# Patient Record
Sex: Male | Born: 1955
Health system: Southern US, Community
[De-identification: ages and names within clinical notes are randomized; demographics above are authoritative.]

## PROBLEM LIST (undated history)

## (undated) DIAGNOSIS — I2510 Atherosclerotic heart disease of native coronary artery without angina pectoris: Secondary | ICD-10-CM

## (undated) DIAGNOSIS — I10 Essential (primary) hypertension: Secondary | ICD-10-CM

## (undated) DIAGNOSIS — E785 Hyperlipidemia, unspecified: Secondary | ICD-10-CM

## (undated) DIAGNOSIS — I77810 Thoracic aortic ectasia: Secondary | ICD-10-CM

## (undated) DIAGNOSIS — Z9289 Personal history of other medical treatment: Secondary | ICD-10-CM

## (undated) DIAGNOSIS — G4733 Obstructive sleep apnea (adult) (pediatric): Secondary | ICD-10-CM

## (undated) DIAGNOSIS — R29898 Other symptoms and signs involving the musculoskeletal system: Secondary | ICD-10-CM

## (undated) DIAGNOSIS — G4730 Sleep apnea, unspecified: Secondary | ICD-10-CM

## (undated) DIAGNOSIS — K589 Irritable bowel syndrome without diarrhea: Secondary | ICD-10-CM

## (undated) DIAGNOSIS — I7121 Aneurysm of the ascending aorta, without rupture: Secondary | ICD-10-CM

## (undated) DIAGNOSIS — Z91199 Patient's noncompliance with other medical treatment and regimen due to unspecified reason: Secondary | ICD-10-CM

## (undated) DIAGNOSIS — Z9119 Patient's noncompliance with other medical treatment and regimen: Secondary | ICD-10-CM

## (undated) DIAGNOSIS — E669 Obesity, unspecified: Secondary | ICD-10-CM

## (undated) DIAGNOSIS — I700 Atherosclerosis of aorta: Secondary | ICD-10-CM

## (undated) DIAGNOSIS — G629 Polyneuropathy, unspecified: Secondary | ICD-10-CM

## (undated) DIAGNOSIS — I712 Thoracic aortic aneurysm, without rupture: Secondary | ICD-10-CM

## (undated) HISTORY — DX: Personal history of other medical treatment: Z92.89

## (undated) HISTORY — DX: Essential (primary) hypertension: I10

## (undated) HISTORY — DX: Sleep apnea, unspecified: G47.30

## (undated) HISTORY — DX: Obstructive sleep apnea (adult) (pediatric): G47.33

## (undated) HISTORY — DX: Polyneuropathy, unspecified: G62.9

## (undated) HISTORY — DX: Patient's noncompliance with other medical treatment and regimen due to unspecified reason: Z91.199

## (undated) HISTORY — DX: Thoracic aortic ectasia: I77.810

## (undated) HISTORY — PX: COLONOSCOPY: SHX174

## (undated) HISTORY — DX: Other symptoms and signs involving the musculoskeletal system: R29.898

## (undated) HISTORY — DX: Obesity, unspecified: E66.9

## (undated) HISTORY — DX: Patient's noncompliance with other medical treatment and regimen: Z91.19

## (undated) HISTORY — DX: Irritable bowel syndrome, unspecified: K58.9

## (undated) HISTORY — DX: Hyperlipidemia, unspecified: E78.5

---

## 1976-02-29 HISTORY — PX: SHOULDER SURGERY: SHX246

## 2002-05-29 ENCOUNTER — Encounter: Payer: Self-pay | Admitting: Internal Medicine

## 2005-02-28 DIAGNOSIS — Z9289 Personal history of other medical treatment: Secondary | ICD-10-CM

## 2005-02-28 HISTORY — DX: Personal history of other medical treatment: Z92.89

## 2006-01-25 ENCOUNTER — Ambulatory Visit: Payer: Self-pay | Admitting: Pulmonary Disease

## 2006-03-01 ENCOUNTER — Ambulatory Visit: Payer: Self-pay | Admitting: Pulmonary Disease

## 2006-03-01 ENCOUNTER — Ambulatory Visit (HOSPITAL_BASED_OUTPATIENT_CLINIC_OR_DEPARTMENT_OTHER): Admission: RE | Admit: 2006-03-01 | Discharge: 2006-03-01 | Payer: Self-pay | Admitting: Pulmonary Disease

## 2006-05-16 ENCOUNTER — Ambulatory Visit: Payer: Self-pay | Admitting: Pulmonary Disease

## 2008-01-07 ENCOUNTER — Emergency Department (HOSPITAL_COMMUNITY): Admission: EM | Admit: 2008-01-07 | Discharge: 2008-01-07 | Payer: Self-pay | Admitting: Emergency Medicine

## 2008-07-01 ENCOUNTER — Ambulatory Visit: Payer: Self-pay | Admitting: Internal Medicine

## 2008-07-01 DIAGNOSIS — K602 Anal fissure, unspecified: Secondary | ICD-10-CM | POA: Insufficient documentation

## 2008-07-29 ENCOUNTER — Ambulatory Visit: Payer: Self-pay | Admitting: Internal Medicine

## 2008-08-28 ENCOUNTER — Telehealth: Payer: Self-pay | Admitting: Internal Medicine

## 2008-09-17 ENCOUNTER — Ambulatory Visit: Payer: Self-pay | Admitting: Internal Medicine

## 2008-09-17 DIAGNOSIS — K589 Irritable bowel syndrome without diarrhea: Secondary | ICD-10-CM | POA: Insufficient documentation

## 2008-11-17 ENCOUNTER — Ambulatory Visit: Payer: Self-pay | Admitting: Internal Medicine

## 2008-11-17 ENCOUNTER — Telehealth: Payer: Self-pay | Admitting: Internal Medicine

## 2008-11-28 ENCOUNTER — Ambulatory Visit: Payer: Self-pay | Admitting: Internal Medicine

## 2008-11-28 HISTORY — PX: COLONOSCOPY: SHX174

## 2009-06-29 ENCOUNTER — Emergency Department (HOSPITAL_COMMUNITY): Admission: EM | Admit: 2009-06-29 | Discharge: 2009-06-29 | Payer: Self-pay | Admitting: Emergency Medicine

## 2009-07-06 ENCOUNTER — Encounter: Admission: RE | Admit: 2009-07-06 | Discharge: 2009-07-06 | Payer: Self-pay | Admitting: Interventional Cardiology

## 2009-08-12 ENCOUNTER — Encounter: Admission: RE | Admit: 2009-08-12 | Discharge: 2009-08-12 | Payer: Self-pay | Admitting: Family Medicine

## 2009-08-18 ENCOUNTER — Encounter: Admission: RE | Admit: 2009-08-18 | Discharge: 2009-08-18 | Payer: Self-pay | Admitting: Family Medicine

## 2010-05-18 LAB — COMPREHENSIVE METABOLIC PANEL
AST: 30 U/L (ref 0–37)
CO2: 26 mEq/L (ref 19–32)
Calcium: 9.1 mg/dL (ref 8.4–10.5)
Creatinine, Ser: 0.94 mg/dL (ref 0.4–1.5)
GFR calc Af Amer: >60 mL/min (ref >60)
Glucose, Bld: 104 mg/dL — ABNORMAL HIGH (ref 70–99)
Sodium: 138 mEq/L (ref 135–145)

## 2010-05-18 LAB — BRAIN NATRIURETIC PEPTIDE: Pro B Natriuretic peptide (BNP): <30 pg/mL (ref 0.0–100.0)

## 2010-07-16 NOTE — Assessment & Plan Note (Signed)
[facility]                             PULMONARY OFFICE NOTE   NAME:TURNERKaiyden, Simkin                          MRN:          [id number]  DATE:01/25/2006                            DOB:          06/28/1955    I saw Mr. Lyon today for evaluation of his sleep apnea.  He was  originally seen by Dr. Fraser Din approximately one year ago, and from the  patient's description it sounds like he had initially undergone a split-  night study, was diagnosed with central sleep apnea, and then was  subsequently scheduled for a titration study using the adaptive Servo  Ventilator.  After that, he was started on an adaptive Servo Ventilator,  was having quite a bit of difficulty tolerating this.  He says he tries  to use the machine on a nightly basis, but he can use it at most only 2  hours because he has difficulty with the fluctuations in the pressure as  well as difficulty with his mask.  He is currently using a hybrid mask  with a heated humidifier, and this seems to be helping somewhat, but he  is still having quite a bit of difficulty.  He had not been tried on  either CPAP or BiPAP previously, although he may have been tried on some  of these during his initial split-night study.  Unfortunately, I do not  have the copy of these studies for my review at this time.  His fiance  says that he does not snore while he uses his adaptive Servo Ventilator,  but that he does when he is not using it.  He also will have apneic  events when he is not using his machine.  He tends to get a dry mouth  and gets sweaty at night, and when he is not using the machine he will  wake up with a coughing sensation.  He has tried using Ambien CR before,  and he says this helps him fall asleep but he still feels quite tired in  the morning.  His current sleep pattern is that he will go to bed at  around 11 o'clock at night.  He falls asleep reasonably quickly, but  then he is awake several  times during the night.  He wakes up finally at  8:30 in the morning but still feels quite tired, but he denies having  headaches in the morning.  He says that for the most part he does not  have feelings of tiredness during the day as long as he stays active,  but then once he is resting then he will start to feel somewhat tired.  He says she has also had problems with nasal congestion and postnasal  drip, and occasional acid reflux.  He denies sleepwalking or  sleeptalking.  There is no history of nightmares or night terrors.  He  denies symptoms of restless leg syndrome.  There is no history of sleep  hallucinations, sleep paralysis or cataplexy.  He is not currently using  anything to help him fall asleep at night, will stay  awake during the  day.  He has gained approximately 20 pounds over the last two years.  He  denies having any history of heart failure.  He also denies having any  history of traumatic brain injuries, strokes, seizures or other  intracerebral lesions, and there is no history of a seizure disorder.  He also denies illicit drug use.   PAST MEDICAL HISTORY:  Significant for hypertension, elevated  cholesterol.   PAST SURGICAL HISTORY:  Significant for shoulder surgery in 1977.   CURRENT MEDICATIONS:  Lotrel.   ALLERGIES:  No known drug allergies.   FAMILY HISTORY:  Significant for his grandmother who had liver cancer,  and his other grandmother had breast cancer.  He had an uncle who had  liver cancer.   SOCIAL HISTORY:  He is engaged, lives with his fiance.  He denies  tobacco use, he drinks 1 to 2 glasses of wine in the evening.  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   REVIEW OF SYSTEMS:  Essentially negative except for as stated above.   PHYSICAL EXAMINATION:  He is 5 feet 11 inches tall, 259 pounds.  Temperature is 98.3, blood pressure is 152/96, heart rate is 109, oxygen  saturation 94% on room air.  HEENT:  Pupils are reactive, extraocular muscles  are intact.  He has a  clear nasal discharge with boggy nasal mucosa.  He has a Mallampati IV  airway with elongated and boggy uvula, with 3+ tonsils and a large  tongue as well as scalloped borders of his tongue.  There is no  lymphadenopathy, no thyromegaly.  HEART:  S1, S2, regular rhythm.  CHEST:  Clear to auscultation.  ABDOMEN:  Obese, soft, nontender.  EXTREMITIES:  There was no edema, cyanosis or clubbing.  NEUROLOGIC:  He is alert and oriented x3, cranial nerves were intact  with 5/5 strength and no cerebellar deficits were appreciated.   IMPRESSION:  1. Sleep disturbance with symptoms of excessive daytime sleepiness and      sleep apnea, in addition to having obesity and hypertension.  He      apparently was found to have central sleep apnea on his original      polysomnogram; however, it would be unusual for him to have pure      central sleep apnea without having underlying either neurologic      disease, significant cardiac disease, or having illicit drug use,      which he has none of.  Therefore, I would be concerned that he may      in fact actually have obstructive sleep apnea which could have      presented as central apneic events, which is not uncommon, and      oftentimes if he has an esophageal probe during his sleep study      this would help to delineate whether in fact he had central sleep      apnea versus obstructive sleep apnea.  Given the fact that he has      had such difficulty with adjusting to his adaptive Servo Ventilator      in addition to there being some question as to whether he actually      has central sleep apnea versus obstructive sleep apnea, I feel that      it would be warranted for him to undergo an additional split-night      study to better assess whether in fact he does have sleep apnea,  and what form of sleep apnea he has, and then also hopefully to     determine if he should continue on an adaptive Servo Ventilator or       perhaps be changed to either a continuous positive airway pressure      machine or bilevel positive airway pressure machine, as he may be      able to tolerate this somewhat better.  2. Sinus congestion with postnasal drip.  This may be contributing to      some of his difficulties with tolerating his Servo Ventilator.  I      have given him a sample of Nasonex, and he is to use 2 sprays in      each nostril once daily, and I have also advised him to use saline      nasal sprays as needed to help control his nasal symptoms.  3. Enlarged tonsils and elongated uvula.  If he continues to have      difficulty tolerating positive airway pressure therapy for his      sleep apnea, particularly if he requires higher pressure      settings, then it may be warranted for him to be evaluated by ear,      nose and throat to see if      surgical correction could assist in his tolerating positive airway      pressure therapy.  4. Followup will be in 4 to 6 weeks.     Coralyn Helling, MD  Electronically Signed    VS/MedQ  DD: 01/25/2006  DT: 01/26/2006  Job #: [id number]

## 2010-07-16 NOTE — Procedures (Signed)
NAME:  Patrick Gilmore, Patrick Gilmore NO.:  [id number]   MEDICAL RECORD NO.:  [id number]          PATIENT TYPE:  OUT   LOCATION:  SLEEP CENTER                 FACILITY:  [facility]   PHYSICIAN:  Coralyn Helling, MD        DATE OF BIRTH:  [birth year]   DATE OF STUDY:  03/01/2006                            NOCTURNAL POLYSOMNOGRAM   FACILITY:  [facility]   REFERRING PHYSICIAN:  Coralyn Helling, MD   INDICATION FOR STUDY:  This is an individual who had symptoms of sleep  disturbance with excessive daytime sleepiness as well as a history of  hypertension.A  He had undergone a previous sleep study at an outside  facility and was diagnosed with central sleep apnea and started on an  adaptive Servo ventilator, however, he was not able to tolerate this.A  He was then scheduled to have a sleep study at this facility on February 16, 2006, but, he had significant insomnia and was not able to have an  adequate study that night.A  He returns to the sleep lab for a sleep  study to further evaluate the possibility of hypersomnia with  obstructive sleep apnea.   EPWORTH SLEEPINESS SCORE:  7.   MEDICATIONS:  Nasonex, Diovan and he took Ambien 5 mg prior to going to  sleep.A   SLEEP ARCHITECTURE:  Total recording time was 433.5 minutes.A  The  patient followed a split-night study protocol.A  The diagnostic portion  of the study consisted of 246 minutes.A  He spent 47% of the time in  wake, 10% of the time in stage I sleep, 41% of the time in stage II  sleep.A  This portion of the study was notable for lack of slow wave  sleep and REM sleep.A  Sleep latency was 43 minutes which was  prolonged.A  He appeared to have difficulty with sleep initiation due to  respiratory events. He was observed in both the supine and non-supine  positions.A   RESPIRATORY DATA:  The average respiratory rate was 18.A  During the  diagnostic portion of the study the apnea/hypopnea index was noted to be  87.8.A  The  events were exclusively obstructive in nature and moderate  to severe snoring was noted by the technician.A  During the therapeutic  portion of the study the patient was titrated from a CPAP pressure  setting of 5 to 12 cm of water.A  At a CPAP pressure setting of 12 cm of  water the apnea/hypopnea index was reduced to 0 and at this pressure  setting he was observed in REM sleep.   OXYGEN DATA:  The baseline oxygenation was 94%.A  The oxygen saturation  nadir was 55%.A  At a CPAP pressure setting of 12 cm of water the mean  oxygenation during non-REM sleep was 94% and during REM sleep was 94%.A   CARDIAC DATA:  The average heart rate was 72 and the rhythm strip showed  normal sinus rhythm.A   MOVEMENT-PARASOMNIA:  The periodic limb movement index was 0 and the  patient had one trip to the restroom.A   IMPRESSIONS-RECOMMENDATIONS:  This was a split-night study  protocol.A  During the diagnostic portion of the study the apnea/hypopnea index was  87.8 with an oxygen saturation nadir of 55%.A  This would be consistent  with a diagnosis of severe hypersomnia with obstructive sleep apnea.A  During the therapeutic portion of the study, at a CPAP pressure setting  of 12 cm of water, the apnea/hypopnea index was reduced to 0.A  At this  pressure setting, he was observed in REM sleep.A  Additionally, his  oxygenation and sleep architecture appeared to stabilize.A  He was  fitted with a medium Mirage Quattro full face mask and heated  humidification.A      Coralyn Helling, MD  Diplomat, American Board of Sleep Medicine  Electronically Signed     VS/MEDQ  D:  03/08/2006 07:14:28  T:  03/08/2006 10:02:35  Job:  [id number]

## 2010-07-16 NOTE — Assessment & Plan Note (Signed)
[facility]                             PULMONARY OFFICE NOTE   NAME:Patrick Gilmore, Patrick Gilmore                          MRN:          [id number]  DATE:05/16/2006                            DOB:          11/05/1955    I saw Patrick Gilmore today for followup of his sleep apnea.   He is currently on CPAP at 12 with full face mask and heated  humidification.   He has noticed a significant improvement in his symptoms since being  switched from the adaptive server ventilator to CPAP.  From his CPAP  download, his apnea/hypopnea index is, on average, 8.9, and he is using  his machine approximately 4-1/2 hours per night.  He says that the main  problems he is having right now is nasal congestion in building up of  moisture in his mask as the night goes on.  As a result, he has to  sometimes take his mask off.  He says he is getting more and more used  to using it, and he does notice the difference on the nights that he is  not able to use it as much.  He says that when he uses it more, he feels  like he has more energy during the day.  He is also going to be starting  a diet and exercise program, as he is planning on getting married later  on this year.  I have encouraged him to continue the use of his CPAP,  and try to increase his usage for the entire night that he is asleep.  I  have also instructed him to contact the home care company to be supplied  with a tube hugger to see if this improves any condensation building up  in his mask.  I have given him a sample and a prescription for Nasacort  AQ 2 sprays in each nostril once daily to see if this alleviates his  symptoms of nasal congestion.  I will then follow up with him in  approximately 4-6 months.     Coralyn Helling, MD  Electronically Signed    VS/MedQ  DD: 05/16/2006  DT: 05/16/2006  Job #: [id number]

## 2010-07-16 NOTE — Assessment & Plan Note (Signed)
[facility]                             PULMONARY OFFICE NOTE   NAME:TURNERYona, Stansbury                          MRN:          [id number]  DATE:03/10/2006                            DOB:          15-Oct-1955    I had called Mr. Leeper to discuss the results of his sleep study which  he had done on March 01, 2006.   This showed evidence for severe obstructive sleep apnea with an apnea-  hypopnea index of 87.8, and oxygen saturation nadir of 55%.   He was titrated to a CPAP pressure setting of 12 cm of water, with a  reduction in his apnea-hypopnea index to 0, with stabilization of his  oxygenation and sleep architecture.   Mr. Cohick is quite anxious to get started on his CPAP therapy since he  has not been able to tolerate the adaptive servo-ventilator for presumed  central sleep apnea, which I did not see any evidence for during his  most recent overnight polysomnogram.  Therefore, I will discontinue the  use of his adaptive servo-ventilator, and I will initiate him on CPAP at  12 cm of water with heated humidification and mask of choice.   I will then make arrangements for him to have followup with me in  approximately 6-8 weeks to determine how well he can tolerate the use of  his CPAP machine.     Coralyn Helling, MD  Electronically Signed    VS/MedQ  DD: 03/10/2006  DT: 03/10/2006  Job #: [id number]

## 2010-11-30 LAB — POCT I-STAT, CHEM 8
BUN: 15
Chloride: 104
Creatinine, Ser: 1.3
Hemoglobin: 15.6
Potassium: 3.3 — ABNORMAL LOW
TCO2: 27

## 2010-11-30 LAB — POCT CARDIAC MARKERS
CKMB, poc: 4.1
Troponin i, poc: <0.05

## 2011-09-29 DIAGNOSIS — R29898 Other symptoms and signs involving the musculoskeletal system: Secondary | ICD-10-CM

## 2011-09-29 DIAGNOSIS — G629 Polyneuropathy, unspecified: Secondary | ICD-10-CM

## 2011-09-29 HISTORY — DX: Polyneuropathy, unspecified: G62.9

## 2011-09-29 HISTORY — DX: Other symptoms and signs involving the musculoskeletal system: R29.898

## 2011-10-04 ENCOUNTER — Other Ambulatory Visit: Payer: Self-pay | Admitting: Internal Medicine

## 2011-10-04 DIAGNOSIS — R9389 Abnormal findings on diagnostic imaging of other specified body structures: Secondary | ICD-10-CM

## 2011-10-07 ENCOUNTER — Ambulatory Visit
Admission: RE | Admit: 2011-10-07 | Discharge: 2011-10-07 | Disposition: A | Discharge status: Home / Self Care | Payer: BC Managed Care – PPO | Source: Ambulatory Visit | Attending: Internal Medicine | Admitting: Internal Medicine

## 2011-10-07 DIAGNOSIS — R9389 Abnormal findings on diagnostic imaging of other specified body structures: Secondary | ICD-10-CM

## 2011-10-07 MED ORDER — GADOBENATE DIMEGLUMINE 529 MG/ML IV SOLN
20.0000 mL | Freq: Once | INTRAVENOUS | Status: AC | PRN
  Administered 2011-10-07: 20 mL via INTRAVENOUS

## 2012-12-06 ENCOUNTER — Other Ambulatory Visit: Payer: Self-pay | Admitting: Cardiology

## 2013-02-12 ENCOUNTER — Telehealth: Payer: Self-pay | Admitting: *Deleted

## 2013-02-12 NOTE — Telephone Encounter (Signed)
Patient needs valsartan-hctz refill to [facility] [place]. Thanks, MI

## 2013-02-13 ENCOUNTER — Other Ambulatory Visit: Payer: Self-pay | Admitting: Cardiology

## 2013-02-13 MED ORDER — VALSARTAN-HYDROCHLOROTHIAZIDE 320-25 MG PO TABS: 1.0000 | ORAL_TABLET | Freq: Every day | ORAL | Status: DC

## 2013-02-13 NOTE — Telephone Encounter (Signed)
Rx sent in for pt.

## 2013-05-17 ENCOUNTER — Other Ambulatory Visit: Payer: Self-pay | Admitting: *Deleted

## 2013-05-17 MED ORDER — VALSARTAN-HYDROCHLOROTHIAZIDE 320-25 MG PO TABS: 1.0000 | ORAL_TABLET | Freq: Every day | ORAL | Status: DC

## 2013-05-27 ENCOUNTER — Ambulatory Visit: Payer: BC Managed Care – PPO | Admitting: Cardiology

## 2013-06-05 ENCOUNTER — Encounter: Payer: Self-pay | Admitting: General Surgery

## 2013-06-05 DIAGNOSIS — I10 Essential (primary) hypertension: Secondary | ICD-10-CM

## 2013-06-06 ENCOUNTER — Encounter: Payer: Self-pay | Admitting: Cardiology

## 2013-06-06 ENCOUNTER — Ambulatory Visit (INDEPENDENT_AMBULATORY_CARE_PROVIDER_SITE_OTHER): Payer: BC Managed Care – PPO | Admitting: Cardiology

## 2013-06-06 VITALS — BP 140/82 | HR 88 | Ht 71.0 in | Wt 281.0 lb

## 2013-06-06 DIAGNOSIS — E785 Hyperlipidemia, unspecified: Secondary | ICD-10-CM

## 2013-06-06 DIAGNOSIS — E669 Obesity, unspecified: Secondary | ICD-10-CM

## 2013-06-06 DIAGNOSIS — I10 Essential (primary) hypertension: Secondary | ICD-10-CM

## 2013-06-06 DIAGNOSIS — G4733 Obstructive sleep apnea (adult) (pediatric): Secondary | ICD-10-CM

## 2013-06-06 NOTE — Patient Instructions (Signed)
Your physician recommends that you continue on your current medications as directed. Please refer to the Current Medication list given to you today.  Your physician recommends that you return for lab work for Fasting LIPID and ALT and BMET tomorrow Friday 06/07/13  Your physician wants you to follow-up in: 6 months with Dr Mallie Snooks will receive a reminder letter in the mail two months in advance. If you don't receive a letter, please call our office to schedule the follow-up appointment.

## 2013-06-06 NOTE — Progress Notes (Signed)
[facility], [address], [place]  [address] Phone: [phone] Fax:  [phone]  Date:  06/06/2013   ID:  Patrick Gilmore, DOB [birth year], MRN [id number]  PCP:  Cloyd Stagers, MD  Cardiologist:  Fransico Him, MD     History of Present Illness: Patrick Gilmore is a 58 y.o. male with a history of HTN, obesity and OSA who presets today for followup.  He is doing well.  He denies any chest pain, LE edema, dizziness, palpitations or syncope.  He has some DOE but has gained more than 10 pounds in the past year.  He is back to using his CPAP device instead of the oral device.   Wt Readings from Last 3 Encounters:  06/06/13 281 lb (127.461 kg)  09/17/08 268 lb 2.1 oz (121.624 kg)  07/29/08 269 lb 4 oz (122.131 kg)     Past Medical History  Diagnosis Date  . OSA (obstructive sleep apnea)     Could not tolerate CPAP now on oral device followed by Dr Toy Cookey  . Obesity   . Medically noncompliant   . Spastic colon   . Sternal mass 09/2011  . Peripheral neuropathy 09/2011  . H/O cardiovascular stress test 2007    EF 62%, decrease anterioseptal egion uptake on rest images only  . Hypertension     Current Outpatient Prescriptions  Medication Sig Dispense Refill  . cloNIDine (CATAPRES) 0.3 MG tablet Take 0.3 mg by mouth. 0.3 MG in the Pm and 0.2 MG in the AM      . CRESTOR 20 MG tablet TAKE 1 TABLET BY MOUTH DAILY  30 tablet  5  . metoprolol succinate (TOPROL-XL) 50 MG 24 hr tablet Take 50 mg by mouth daily. Take with or immediately following a meal.      . valsartan-hydrochlorothiazide (DIOVAN-HCT) 320-25 MG per tablet Take 1 tablet by mouth daily.  90 tablet  0   No current facility-administered medications for this visit.    Allergies:    Allergies  Allergen Reactions  . Norvasc [Amlodipine Besylate]     Headaches    Social History:  The patient  reports that he has never smoked. He does not have any smokeless tobacco history on file. He reports that he drinks  alcohol. He reports that he does not use illicit drugs.   Family History:  The patient's family history is not on file.   ROS:  Please see the history of present illness.      All other systems reviewed and negative.   PHYSICAL EXAM: VS:  BP 140/82  Pulse 88  Ht 5\' 11"  (1.803 m)  Wt 281 lb (127.461 kg)  BMI 39.21 kg/m2  SpO2 97% Well nourished, well developed, in no acute distress HEENT: normal Neck: no JVD Cardiac:  normal S1, S2; RRR; no murmur Lungs:  clear to auscultation bilaterally, no wheezing, rhonchi or rales Abd: soft, nontender, no hepatomegaly Ext: no edema Skin: warm and dry Neuro:  CNs 2-12 intact, no focal abnormalities noted       ASSESSMENT AND PLAN:  1. HTN well controlled - continue Clonidine/Diovan HCT/Toprol - check BMET 2.  Obesity - I have encouraged him to get into a walking program and watching his portions.   3.  OSA now back on CPAP because he did not like the oral device - I will get a download from his DME 4.  Dyslipidemia - continue Crestor - check Fasting lipid and ALT  Followup  with me in 6 months  Signed, Fransico Him, MD 06/06/2013 4:43 PM

## 2013-06-07 ENCOUNTER — Other Ambulatory Visit: Payer: BC Managed Care – PPO

## 2013-06-11 ENCOUNTER — Other Ambulatory Visit (INDEPENDENT_AMBULATORY_CARE_PROVIDER_SITE_OTHER): Payer: BC Managed Care – PPO

## 2013-06-11 DIAGNOSIS — E785 Hyperlipidemia, unspecified: Secondary | ICD-10-CM

## 2013-06-11 DIAGNOSIS — I10 Essential (primary) hypertension: Secondary | ICD-10-CM

## 2013-06-11 LAB — BASIC METABOLIC PANEL
BUN: 15 mg/dL (ref 6–23)
CO2: 27 meq/L (ref 19–32)
CREATININE: 1 mg/dL (ref 0.4–1.5)
Calcium: 9.1 mg/dL (ref 8.4–10.5)
Chloride: 102 mEq/L (ref 96–112)
GFR: 84.89 mL/min (ref >60.00)
Glucose, Bld: 93 mg/dL (ref 70–99)
POTASSIUM: 3.8 meq/L (ref 3.5–5.1)
SODIUM: 138 meq/L (ref 135–145)

## 2013-06-11 LAB — LIPID PANEL
CHOL/HDL RATIO: 4
Cholesterol: 137 mg/dL (ref 0–200)
HDL: 38.6 mg/dL — AB (ref >39.00)
LDL Cholesterol: 69 mg/dL (ref 0–99)
TRIGLYCERIDES: 147 mg/dL (ref 0.0–149.0)
VLDL: 29.4 mg/dL (ref 0.0–40.0)

## 2013-06-11 LAB — ALT: ALT: 66 U/L — AB (ref 0–53)

## 2013-06-19 ENCOUNTER — Encounter: Payer: Self-pay | Admitting: General Surgery

## 2013-06-19 ENCOUNTER — Other Ambulatory Visit: Payer: Self-pay | Admitting: General Surgery

## 2013-06-19 DIAGNOSIS — E785 Hyperlipidemia, unspecified: Secondary | ICD-10-CM

## 2013-06-20 ENCOUNTER — Other Ambulatory Visit: Payer: Self-pay

## 2013-06-20 MED ORDER — ROSUVASTATIN CALCIUM 20 MG PO TABS: ORAL_TABLET | ORAL | Status: DC

## 2013-09-27 ENCOUNTER — Other Ambulatory Visit: Payer: Self-pay

## 2013-09-27 MED ORDER — CLONIDINE HCL 0.3 MG PO TABS: 0.3000 mg | ORAL_TABLET | Freq: Every day | ORAL | Status: DC

## 2013-10-09 ENCOUNTER — Telehealth: Payer: Self-pay | Admitting: *Deleted

## 2013-10-09 ENCOUNTER — Encounter: Payer: Self-pay | Admitting: Internal Medicine

## 2013-10-09 NOTE — Telephone Encounter (Signed)
The last time pt was seen at [facility] we had increased his Toprol to 1 and 1/2 tablets daily. When we saw pt this year and went over medications he did not correct [place] when we stated he only took 50 MG daily. I called pt and LVM for him to call back and verify dosage that he takes.

## 2013-10-09 NOTE — Telephone Encounter (Signed)
[facility] [place] requests metoprolol refill. The chart says the patient takes 50mg  qd, but the pharmacy is requesting 75mg  qd. Please advise. Thanks, MI

## 2013-10-11 ENCOUNTER — Other Ambulatory Visit: Payer: Self-pay

## 2013-10-11 MED ORDER — METOPROLOL SUCCINATE ER 50 MG PO TB24: 50.0000 mg | ORAL_TABLET | Freq: Every day | ORAL | Status: DC

## 2013-10-14 NOTE — Telephone Encounter (Signed)
LMTRC

## 2013-10-14 NOTE — Telephone Encounter (Signed)
Called pt and could not leave a VM. Will attempt again later today.

## 2013-10-15 NOTE — Telephone Encounter (Signed)
lmtrc

## 2013-10-17 NOTE — Telephone Encounter (Signed)
lmtrc

## 2013-10-18 ENCOUNTER — Other Ambulatory Visit: Payer: BC Managed Care – PPO

## 2013-10-18 ENCOUNTER — Telehealth: Payer: Self-pay | Admitting: Cardiology

## 2013-10-18 NOTE — Telephone Encounter (Signed)
Attempted to call pt again on Cell phone number and the phone just kept ringing. I was unable to reach pt.

## 2013-10-18 NOTE — Telephone Encounter (Signed)
New message     Please call him back on his cell [phone]

## 2013-10-18 NOTE — Telephone Encounter (Signed)
Please Deny request. I have made multiple attempts to call pt and he has not returned my call. He will have to call the office before we can refill. Thank you

## 2013-10-18 NOTE — Telephone Encounter (Signed)
Called pt on cell phone as requested. There was no answer and I could not LVM phone just kept ringing.    The last time pt was seen at [facility] we had increased his Toprol to 1 and 1/2 tablets daily. When we saw pt this year and went over medications he did not correct [place] when we stated he only took 50 MG daily. I called pt and LVM for him to call back and verify dosage that he takes.

## 2013-10-21 NOTE — Telephone Encounter (Signed)
LVM at [phone] (M)

## 2013-10-22 MED ORDER — CLONIDINE HCL 0.2 MG PO TABS: ORAL_TABLET | ORAL | Status: DC

## 2013-10-22 MED ORDER — METOPROLOL SUCCINATE ER 50 MG PO TB24: 50.0000 mg | ORAL_TABLET | Freq: Every day | ORAL | Status: DC

## 2013-10-22 NOTE — Telephone Encounter (Signed)
Sending back as a FYI. Pt is only taking one tablet 50 MG of his Toprol daily  Sent that and Clonidine 0.2 MG in for pt since he is on both that dosage and 0.3 MG for Clonidine.

## 2013-10-22 NOTE — Telephone Encounter (Signed)
lmtrc

## 2013-10-22 NOTE — Telephone Encounter (Signed)
Pt is taking only 50 MG daily.  Pt is taking clonidine 0.2 in the am and 0.3 MG in the evening.

## 2013-11-15 ENCOUNTER — Other Ambulatory Visit: Payer: Self-pay

## 2013-11-15 MED ORDER — CLONIDINE HCL 0.3 MG PO TABS: ORAL_TABLET | ORAL | Status: DC

## 2013-11-21 ENCOUNTER — Other Ambulatory Visit: Payer: Self-pay | Admitting: Cardiology

## 2013-11-25 ENCOUNTER — Telehealth: Payer: Self-pay | Admitting: Cardiology

## 2013-11-25 MED ORDER — CLONIDINE HCL 0.3 MG PO TABS: 0.3000 mg | ORAL_TABLET | Freq: Two times a day (BID) | ORAL | Status: DC

## 2013-11-25 NOTE — Telephone Encounter (Signed)
Follow up     Returning call back to [place]

## 2013-11-25 NOTE — Telephone Encounter (Signed)
[facility] 11/25/13

## 2013-11-25 NOTE — Telephone Encounter (Signed)
lmtrc

## 2013-11-25 NOTE — Telephone Encounter (Signed)
New message     Talk to Patrick Gilmore about his bp---it is 160/102

## 2013-11-25 NOTE — Telephone Encounter (Signed)
Increase Clonidine to 0.3mg  BID and check BP and HR daily for a week and call with results

## 2013-11-25 NOTE — Telephone Encounter (Signed)
Pts BP's have been ranging 150-160s/90s-100s  Pt states that he has had Has because of the BP.   Call Pt on [phone]

## 2013-11-25 NOTE — Telephone Encounter (Signed)
Pt insisted on seeing someone when he returned from [place]. I set him up with one of the PAs on 11/28/13

## 2013-11-25 NOTE — Telephone Encounter (Signed)
Pt is aware and Updated med list for pt. HE will bring BP readings in to PA when he sees them on 11/28/13

## 2013-11-28 ENCOUNTER — Ambulatory Visit: Payer: BC Managed Care – PPO | Admitting: Nurse Practitioner

## 2013-12-02 ENCOUNTER — Encounter: Payer: Self-pay | Admitting: Cardiology

## 2013-12-02 ENCOUNTER — Ambulatory Visit (INDEPENDENT_AMBULATORY_CARE_PROVIDER_SITE_OTHER): Payer: BC Managed Care – PPO | Admitting: Cardiology

## 2013-12-02 VITALS — BP 124/72 | HR 74 | Ht 71.0 in | Wt 270.0 lb

## 2013-12-02 DIAGNOSIS — G4733 Obstructive sleep apnea (adult) (pediatric): Secondary | ICD-10-CM

## 2013-12-02 DIAGNOSIS — E785 Hyperlipidemia, unspecified: Secondary | ICD-10-CM

## 2013-12-02 DIAGNOSIS — E669 Obesity, unspecified: Secondary | ICD-10-CM

## 2013-12-02 DIAGNOSIS — I10 Essential (primary) hypertension: Secondary | ICD-10-CM

## 2013-12-02 LAB — BASIC METABOLIC PANEL
BUN: 16 mg/dL (ref 6–23)
CO2: 26 meq/L (ref 19–32)
CREATININE: 1 mg/dL (ref 0.4–1.5)
Calcium: 9.4 mg/dL (ref 8.4–10.5)
Chloride: 99 mEq/L (ref 96–112)
GFR: 79.08 mL/min (ref >60.00)
Glucose, Bld: 119 mg/dL — ABNORMAL HIGH (ref 70–99)
Potassium: 3.5 mEq/L (ref 3.5–5.1)
SODIUM: 134 meq/L — AB (ref 135–145)

## 2013-12-02 LAB — ALT: ALT: 52 U/L (ref 0–53)

## 2013-12-02 NOTE — Patient Instructions (Signed)
Your physician recommends that you return for lab work today for Bmet and ALT.  Your physician wants you to follow-up in: 6 months with Dr. Radford Pax. You will receive a reminder letter in the mail two months in advance. If you don't receive a letter, please call our office to schedule the follow-up appointment.

## 2013-12-02 NOTE — Progress Notes (Signed)
[place], [place], [place]  [address] Phone: [phone] Fax:  [phone]  Date:  12/02/2013   ID:  Patrick Gilmore, DOB [birth year], MRN [id number]  PCP:  Cloyd Stagers, MD  Cardiologist:  Fransico Him, MD    History of Present Illness: Patrick Gilmore is a 58 y.o. male with a history of HTN, obesity, dyslipidemia and OSA who presents today for followup. He is having some problems getting into deep sleep and wakes up around 2-3am and then goes back to sleep around 4am. He drinks a few glasses of wine nightly before bed.  He denies any chest pain, SOB,LE edema, dizziness, palpitations or syncope.  He tolerates his CPAP device without any difficulty.  He tolerates the full face mask but got a new mask today due to leaking of the other mask.  He feels the pressure is adequate.  He feels rested in the am and has no daytime sleepiness if he sleeps well at night.  He denies any nasal congestion and dose not snore if he wears his device.     Wt Readings from Last 3 Encounters:  12/02/13 270 lb (122.471 kg)  06/06/13 281 lb (127.461 kg)  09/17/08 268 lb 2.1 oz (121.624 kg)     Past Medical History  Diagnosis Date  . OSA (obstructive sleep apnea)     Could not tolerate CPAP now on oral device followed by Dr Toy Cookey  . Obesity   . Medically noncompliant   . Spastic colon   . Sternal mass 09/2011  . Peripheral neuropathy 09/2011  . H/O cardiovascular stress test 2007    EF 62%, decrease anterioseptal egion uptake on rest images only  . Hypertension     Current Outpatient Prescriptions  Medication Sig Dispense Refill  . cloNIDine (CATAPRES) 0.3 MG tablet Take 1 tablet (0.3 mg total) by mouth 2 (two) times daily.  60 tablet  6  . metoprolol succinate (TOPROL-XL) 50 MG 24 hr tablet Take 75 mg by mouth daily. Take with or immediately following a meal.      . rosuvastatin (CRESTOR) 20 MG tablet TAKE 1 TABLET BY MOUTH DAILY  30 tablet  5  . valsartan-hydrochlorothiazide  (DIOVAN-HCT) 320-25 MG per tablet TAKE 1 TABLET BY MOUTH EVERY DAY  90 tablet  0   No current facility-administered medications for this visit.    Allergies:    Allergies  Allergen Reactions  . Norvasc [Amlodipine Besylate]     Headaches    Social History:  The patient  reports that he has never smoked. He does not have any smokeless tobacco history on file. He reports that he drinks alcohol. He reports that he does not use illicit drugs.   Family History:  The patient's family history is not on file.   ROS:  Please see the history of present illness.      All other systems reviewed and negative.   PHYSICAL EXAM: VS:  BP 124/72  Pulse 74  Ht 5\' 11"  (1.803 m)  Wt 270 lb (122.471 kg)  BMI 37.67 kg/m2  SpO2 98% Well nourished, well developed, in no acute distress HEENT: normal Neck: no JVD Cardiac:  normal S1, S2; RRR; no murmur Lungs:  clear to auscultation bilaterally, no wheezing, rhonchi or rales Abd: soft, nontender, no hepatomegaly Ext: no edema Skin: warm and dry Neuro:  CNs 2-12 intact, no focal abnormalities noted   ASSESSMENT AND PLAN:  1.  HTN well controlled - continue Clonidine/Diovan  HCT/Toprol  - check BMET  2. Obesity  - I have encouraged him to get into a walking program and watching his portions.  3. OSA on CPAP tolerating well.  His d/l today showed an AHI of 3.9/hr on 12cm H2O and only 5% compliance in using more than 4 hours nightly.  He just started using it again 2 weeks ago after using the mouth guard for a while. - continue CPAP at current settings - I have encouraged him to avoid ETOH at night which may be contributing to him waking up at night - I encouraged him to be more compliant 4. Dyslipidemia - LDL at goal at 69 - continue Crestor  5.  Mildly elevated ALT in the past - recheck ALT today  Followup with me in 6 months     Signed, Fransico Him, MD [facility] 12/02/2013 9:45 AM

## 2013-12-06 ENCOUNTER — Encounter: Payer: Self-pay | Admitting: Cardiology

## 2013-12-11 ENCOUNTER — Other Ambulatory Visit: Payer: Self-pay | Admitting: Cardiology

## 2014-01-20 ENCOUNTER — Telehealth: Payer: Self-pay | Admitting: Cardiology

## 2014-01-20 DIAGNOSIS — R079 Chest pain, unspecified: Secondary | ICD-10-CM

## 2014-01-20 DIAGNOSIS — G4733 Obstructive sleep apnea (adult) (pediatric): Secondary | ICD-10-CM

## 2014-01-20 NOTE — Telephone Encounter (Signed)
New Msg  Shelly from Dr. Corky Sing office would like a call back with concerns about a sleep study. Please contact at [phone].

## 2014-01-22 NOTE — Telephone Encounter (Signed)
Per office message on VM, office is closed until Monday. Will call back then.

## 2014-01-28 NOTE — Telephone Encounter (Signed)
Left message for Patrick Gilmore to call back tomorrow if she still needs assistance.

## 2014-01-29 NOTE — Telephone Encounter (Signed)
Follow up     Returning  Patrick Gilmore's call.  Talk about a sleep study for pt.

## 2014-01-30 NOTE — Telephone Encounter (Signed)
Please set up for in lab split night PSG with mouth guard.  Also please find out when patient is having chest pain and what he is doing at the time of his CP and how often it is occurring

## 2014-01-30 NOTE — Telephone Encounter (Signed)
Shelly at Dr. Corky Sing office states that the patient needs an in-lab sleep study.  She st his last sleep study was in May 2014 and per pt had been doing well. She st pt can not wear CPAP now. He c/o chest discomfort when lying down. Shelly st they have been doing take home sleep studies and he is a horrible sleeper and his appliance is not doing enough--AHI too high. She wants to set up a combo study to see if settings can be titrated down enough for the patient to be able to wear CPAP.  Shelly st if Dr. Radford Pax needs to speak with Dr. Radford Pax, to call. [phone].   To Dr. Radford Pax for review and recommendations.

## 2014-01-31 NOTE — Telephone Encounter (Signed)
Left message to call back  

## 2014-02-05 NOTE — Telephone Encounter (Signed)
Left message to call back  

## 2014-02-14 ENCOUNTER — Other Ambulatory Visit: Payer: Self-pay | Admitting: Cardiology

## 2014-02-17 NOTE — Telephone Encounter (Signed)
Spoke with patient about Dr. Theodosia Blender recommendation for split night PSG with mouthguard. Patient st his chest discomfort is in the morning when he wakes up. He st "it isn't that bad, but it's noticeable annoying."  Sleep study ordered for scheduling.

## 2014-02-18 NOTE — Telephone Encounter (Signed)
Set up stress myoview for CP to rule out ischemia

## 2014-02-18 NOTE — Telephone Encounter (Signed)
Left message to call back  

## 2014-02-19 NOTE — Telephone Encounter (Signed)
Informed patient of Dr. Theodosia Blender recommendation to have stress myoview. Patient agrees to treatment plan.  Nuc test ordered for scheduling.

## 2014-03-13 ENCOUNTER — Telehealth: Payer: Self-pay | Admitting: Cardiology

## 2014-03-13 NOTE — Telephone Encounter (Signed)
Unable to reach patient to schedule stress test.  Left voice message on 02-20-14, 02-25-14., 03-03-14 and 03-13-14. Will defer.

## 2014-03-19 ENCOUNTER — Encounter: Payer: Self-pay | Admitting: Cardiology

## 2014-04-28 ENCOUNTER — Encounter: Payer: Self-pay | Admitting: Cardiology

## 2014-05-04 ENCOUNTER — Ambulatory Visit (HOSPITAL_BASED_OUTPATIENT_CLINIC_OR_DEPARTMENT_OTHER): Payer: BLUE CROSS/BLUE SHIELD | Attending: Cardiology

## 2014-05-04 VITALS — Ht 71.0 in | Wt 270.0 lb

## 2014-05-04 DIAGNOSIS — G4733 Obstructive sleep apnea (adult) (pediatric): Secondary | ICD-10-CM | POA: Insufficient documentation

## 2014-05-11 ENCOUNTER — Telehealth: Payer: Self-pay | Admitting: Cardiology

## 2014-05-11 DIAGNOSIS — G4733 Obstructive sleep apnea (adult) (pediatric): Secondary | ICD-10-CM

## 2014-05-11 NOTE — Sleep Study (Signed)
   NAME: Patrick Gilmore DATE OF BIRTH:  1955-12-16 MEDICAL RECORD NUMBER [id number]  LOCATION: [facility]  PHYSICIAN: Sonnenfeld,TRACI R  DATE OF STUDY: 05/04/2014  SLEEP STUDY TYPE: Nocturnal Polysomnogram               REFERRING PHYSICIAN: Sueanne Margarita, MD  INDICATION FOR STUDY: Obstructive Sleep Apnea  EPWORTH SLEEPINESS SCORE: 6 HEIGHT: 5\' 11"  (180.3 cm)  WEIGHT: 270 lb (122.471 kg)    Body mass index is 37.67 kg/(m^2).  NECK SIZE: 18 in.  MEDICATIONS: Reviewed in the chart  SLEEP ARCHITECTURE: The patient slept for a total of 206 minutes out of a total sleep period of 377 minutes.  There was no REM sleep or slow wave sleep.  The onset to sleep latency was 7 minutes.  The sleep efficiency was reduced at 54%.  There was an increased frequency of  arousals due to respiratory events with an index of 14.5 events per hour.  RESPIRATORY DATA: There were 182 apneas, of which, 150 were obstructive and 32 were central apnea.  THere were 125 obstructive hypopneas.  All events occurred in NREM sleep with most occurring int he non supine position.   The overall AHI was 89 events per hour consistent with severe obstructive sleep apnea.  There was severe snoring noted.     OXYGEN DATA: The lowest oxygen saturation noted was 82%.  O2 saturations were below 88% for 18 mintues during the study.    CARDIAC DATA: The patient maintained NSR throughout the study.  The average heart rate was 60 bpm.  The maximum heart rate was 153 bpm and the minimum heart rate was 30 bpm.    MOVEMENT/PARASOMNIA: There were no periodic limb movements or REM sleep behavior disorders.  IMPRESSION/ RECOMMENDATION:   1.  Severe obstructive sleep apnea/hypopnea syndrome with an AHI of 89 events per hour.   All events occurred in NREM sleep with most occurring int he non supine position. 2.  Severe snoring was noted.  3.  No REM sleep was noted. 4.  Reduced sleep efficiency with increased frequency of  arousals due to respiratory events.   5.  Oxygen desaturations due to respiratory events.  6.  NSR was maintained throughout the study. 7.  The patient should be counseled on good sleep hygiene and weigh loss. 8.  In light of patient's severe degree of sleep apnea with associated oxygen desaturations and increased frequency of arousals, recommend proceeding with CPAP titration.    Signed: Sueanne Margarita Diplomate, American Board of Sleep Medicine  ELECTRONICALLY SIGNED ON:  05/11/2014, 4:43 PM [place] PH: [phone]   FX: [phone] [place]

## 2014-05-11 NOTE — Telephone Encounter (Signed)
Please let patient know that he has severe sleep apnea and set up for CPAP titration

## 2014-05-14 NOTE — Addendum Note (Signed)
Addended by: Andres Ege on: 05/14/2014 02:18 PM   Modules accepted: Orders

## 2014-05-14 NOTE — Telephone Encounter (Signed)
Pt. Is aware of results and okay to proceed with CPAP titration. He also scheduled his 6 month f/u appointment for May.

## 2014-05-17 ENCOUNTER — Other Ambulatory Visit: Payer: Self-pay | Admitting: Internal Medicine

## 2014-06-09 ENCOUNTER — Other Ambulatory Visit: Payer: Self-pay | Admitting: Cardiology

## 2014-06-29 ENCOUNTER — Other Ambulatory Visit: Payer: Self-pay | Admitting: Cardiology

## 2014-07-06 ENCOUNTER — Other Ambulatory Visit: Payer: Self-pay | Admitting: Cardiology

## 2014-07-15 ENCOUNTER — Ambulatory Visit (INDEPENDENT_AMBULATORY_CARE_PROVIDER_SITE_OTHER): Payer: BLUE CROSS/BLUE SHIELD | Admitting: Cardiology

## 2014-07-15 ENCOUNTER — Encounter: Payer: Self-pay | Admitting: Cardiology

## 2014-07-15 VITALS — BP 110/58 | HR 82 | Ht 71.0 in | Wt 277.8 lb

## 2014-07-15 DIAGNOSIS — E785 Hyperlipidemia, unspecified: Secondary | ICD-10-CM

## 2014-07-15 NOTE — Progress Notes (Signed)
Cardiology Office Note   Date:  07/15/2014   ID:  ZAIAH Gilmore, DOB 1955-05-12, MRN [id number]  PCP:  Lottie Dawson, MD    Chief Complaint  Patient presents with  . Follow-up    Sleep Apnea      History of Present Illness: Patrick Gilmore is a 59 y.o. male with a history of HTN, obesity, dyslipidemia and OSA who presents today for followup.  He has not been tolerating the CPAP or the mask and so he was referred back for repeat sleep study which showed severe OSA with an AHI of 89/hr.He currently uses a full face mask. He feels the pressure is adequate. He is due to go back to the sleep lab for a CPAP titration in the near future.     Past Medical History  Diagnosis Date  . OSA (obstructive sleep apnea)     Could not tolerate CPAP now on oral device followed by Dr Toy Cookey  . Obesity   . Medically noncompliant   . Spastic colon   . Sternal mass 09/2011  . Peripheral neuropathy 09/2011  . H/O cardiovascular stress test 2007    EF 62%, decrease anterioseptal egion uptake on rest images only  . Hypertension     Past Surgical History  Procedure Laterality Date  . Colonoscopy      normal 10/10 return 10/20     Current Outpatient Prescriptions  Medication Sig Dispense Refill  . cloNIDine (CATAPRES) 0.3 MG tablet TAKE 1 TABLET BY MOUTH TWICE A DAY 60 tablet 1  . CRESTOR 20 MG tablet TAKE 1 TABLET BY MOUTH EVERY DAY 30 tablet 11  . metoprolol succinate (TOPROL-XL) 50 MG 24 hr tablet Take 75 mg by mouth daily. Take with or immediately following a meal.    . valsartan-hydrochlorothiazide (DIOVAN-HCT) 320-25 MG per tablet TAKE 1 TABLET BY MOUTH EVERY DAY 90 tablet 0   No current facility-administered medications for this visit.    Allergies:   Norvasc    Social History:  The patient  reports that he has never smoked. He does not have any smokeless tobacco history on file. He reports that he drinks alcohol. He reports that he does not use illicit drugs.    Family History:  The patient's family history includes Breast cancer in his sister; Cancer in his mother; Kidney failure in his father.    ROS:  Please see the history of present illness.   Otherwise, review of systems are positive for none.   All other systems are reviewed and negative.    PHYSICAL EXAM: VS:  BP 110/58 mmHg  Pulse 82  Ht 5\' 11"  (1.803 m)  Wt 277 lb 12.8 oz (126.009 kg)  BMI 38.76 kg/m2  SpO2 95% , BMI Body mass index is 38.76 kg/(m^2). GEN: Well nourished, well developed, in no acute distress HEENT: normal Neck: no JVD, carotid bruits, or masses Cardiac: RRR; no murmurs, rubs, or gallops,no edema  Respiratory:  clear to auscultation bilaterally, normal work of breathing GI: soft, nontender, nondistended, + BS MS: no deformity or atrophy Skin: warm and dry, no rash Neuro:  Strength and sensation are intact Psych: euthymic mood, full affect   EKG:  EKG is not ordered today.    Recent Labs: 12/02/2013: ALT 52; BUN 16; Creatinine 1.0; Potassium 3.5; Sodium 134*    Lipid Panel    Component Value Date/Time   CHOL 137 06/11/2013 1253   TRIG 147.0 06/11/2013 1253   HDL 38.60* 06/11/2013  1253   CHOLHDL 4 06/11/2013 1253   VLDL 29.4 06/11/2013 1253   LDLCALC 69 06/11/2013 1253      Wt Readings from Last 3 Encounters:  07/15/14 277 lb 12.8 oz (126.009 kg)  05/04/14 270 lb (122.471 kg)  12/02/13 270 lb (122.471 kg)    ASSESSMENT AND PLAN:  1. HTN well controlled - continue Clonidine/Diovan HCT/Toprol  - check BMET  2. Obesity  - I have encouraged him to get into a more intense walking program and watching his portions.  3. OSA on CPAP tolerating well. 4. Dyslipidemia - LDL at goal at 69 - continue Crestor  - check FLP and ALT   Current medicines are reviewed at length with the patient today.  The patient does not have concerns regarding medicines.  The following changes have been made:  no change  Labs/ tests ordered today include: see  above assessment and plan No orders of the defined types were placed in this encounter.     Disposition:   FU with me in 6 months   Signed, Sueanne Margarita, MD  07/15/2014 3:30 PM    [address], [place], [place]  [address] Phone: [phone]; Fax: [phone]

## 2014-07-15 NOTE — Patient Instructions (Signed)
Medication Instructions:  Your physician recommends that you continue on your current medications as directed. Please refer to the Current Medication list given to you today.   Labwork: You have a FASTING LAB APPOINTMENT tomorrow, May 18. You may come ANY TIME between 7:30 AM and 5:00 PM.  Testing/Procedures: None  Follow-Up: Your physician wants you to follow-up in: 6 months with Dr. Radford Pax. You will receive a reminder letter in the mail two months in advance. If you don't receive a letter, please call our office to schedule the follow-up appointment.   Any Other Special Instructions Will Be Listed Below (If Applicable).

## 2014-07-16 ENCOUNTER — Other Ambulatory Visit (INDEPENDENT_AMBULATORY_CARE_PROVIDER_SITE_OTHER): Payer: BLUE CROSS/BLUE SHIELD | Admitting: *Deleted

## 2014-07-16 DIAGNOSIS — E785 Hyperlipidemia, unspecified: Secondary | ICD-10-CM

## 2014-07-16 LAB — LIPID PANEL
Cholesterol: 133 mg/dL (ref 0–200)
HDL: 39.2 mg/dL (ref >39.00)
LDL CALC: 69 mg/dL (ref 0–99)
NONHDL: 93.8
Total CHOL/HDL Ratio: 3
Triglycerides: 122 mg/dL (ref 0.0–149.0)
VLDL: 24.4 mg/dL (ref 0.0–40.0)

## 2014-07-16 LAB — HEPATIC FUNCTION PANEL
ALT: 54 U/L — ABNORMAL HIGH (ref 0–53)
AST: 30 U/L (ref 0–37)
Albumin: 4.1 g/dL (ref 3.5–5.2)
Alkaline Phosphatase: 39 U/L (ref 39–117)
BILIRUBIN DIRECT: 0.1 mg/dL (ref 0.0–0.3)
BILIRUBIN TOTAL: 0.6 mg/dL (ref 0.2–1.2)
Total Protein: 7.1 g/dL (ref 6.0–8.3)

## 2014-07-16 NOTE — Addendum Note (Signed)
Addended by: Eulis Foster on: 07/16/2014 08:41 AM   Modules accepted: Orders

## 2014-07-17 ENCOUNTER — Encounter (HOSPITAL_BASED_OUTPATIENT_CLINIC_OR_DEPARTMENT_OTHER): Payer: Self-pay

## 2014-08-17 ENCOUNTER — Other Ambulatory Visit: Payer: Self-pay | Admitting: Cardiology

## 2014-08-18 ENCOUNTER — Other Ambulatory Visit: Payer: Self-pay

## 2014-08-18 MED ORDER — VALSARTAN-HYDROCHLOROTHIAZIDE 320-25 MG PO TABS: 1.0000 | ORAL_TABLET | Freq: Every day | ORAL | Status: DC

## 2014-08-27 ENCOUNTER — Other Ambulatory Visit: Payer: Self-pay | Admitting: Cardiology

## 2014-08-28 ENCOUNTER — Other Ambulatory Visit: Payer: Self-pay | Admitting: Cardiology

## 2014-09-21 ENCOUNTER — Encounter (HOSPITAL_BASED_OUTPATIENT_CLINIC_OR_DEPARTMENT_OTHER): Payer: Self-pay

## 2014-10-29 ENCOUNTER — Other Ambulatory Visit: Payer: Self-pay | Admitting: Cardiology

## 2014-11-11 ENCOUNTER — Emergency Department (HOSPITAL_COMMUNITY)
Admission: EM | Admit: 2014-11-11 | Discharge: 2014-11-11 | Discharge status: Against Medical Advice | Payer: BLUE CROSS/BLUE SHIELD | Attending: Emergency Medicine | Admitting: Emergency Medicine

## 2014-11-11 ENCOUNTER — Encounter (HOSPITAL_COMMUNITY): Payer: Self-pay | Admitting: Emergency Medicine

## 2014-11-11 DIAGNOSIS — Y9389 Activity, other specified: Secondary | ICD-10-CM | POA: Diagnosis not present

## 2014-11-11 DIAGNOSIS — S3991XA Unspecified injury of abdomen, initial encounter: Secondary | ICD-10-CM | POA: Insufficient documentation

## 2014-11-11 DIAGNOSIS — S3992XA Unspecified injury of lower back, initial encounter: Secondary | ICD-10-CM | POA: Insufficient documentation

## 2014-11-11 DIAGNOSIS — E669 Obesity, unspecified: Secondary | ICD-10-CM | POA: Diagnosis not present

## 2014-11-11 DIAGNOSIS — I10 Essential (primary) hypertension: Secondary | ICD-10-CM | POA: Insufficient documentation

## 2014-11-11 DIAGNOSIS — Y998 Other external cause status: Secondary | ICD-10-CM | POA: Insufficient documentation

## 2014-11-11 DIAGNOSIS — W1839XA Other fall on same level, initial encounter: Secondary | ICD-10-CM | POA: Insufficient documentation

## 2014-11-11 DIAGNOSIS — Y9289 Other specified places as the place of occurrence of the external cause: Secondary | ICD-10-CM | POA: Diagnosis not present

## 2014-11-11 NOTE — ED Notes (Signed)
Pt states that he was in the attic working yesterday when he fell approx 8-9 feet. Pt c/o mid right side back pain, epigastric pain. Pt has brusing to to right upper arm.  Pt denies LOC or blood thinners.

## 2014-11-11 NOTE — ED Notes (Signed)
This Probation officer told by registration that pt was leaving.

## 2014-12-02 ENCOUNTER — Other Ambulatory Visit: Payer: Self-pay | Admitting: Cardiology

## 2014-12-31 ENCOUNTER — Other Ambulatory Visit: Payer: Self-pay | Admitting: Cardiology

## 2015-01-06 ENCOUNTER — Other Ambulatory Visit: Payer: Self-pay | Admitting: Cardiology

## 2015-02-02 ENCOUNTER — Other Ambulatory Visit: Payer: Self-pay | Admitting: *Deleted

## 2015-02-02 MED ORDER — VALSARTAN-HYDROCHLOROTHIAZIDE 320-25 MG PO TABS: 1.0000 | ORAL_TABLET | Freq: Every day | ORAL | Status: DC

## 2015-02-11 ENCOUNTER — Encounter: Payer: Self-pay | Admitting: Pulmonary Disease

## 2015-02-11 ENCOUNTER — Encounter: Payer: Self-pay | Admitting: Cardiovascular Disease

## 2015-03-09 ENCOUNTER — Ambulatory Visit: Payer: BLUE CROSS/BLUE SHIELD | Admitting: Cardiology

## 2015-03-11 ENCOUNTER — Other Ambulatory Visit: Payer: Self-pay | Admitting: Cardiology

## 2015-04-27 ENCOUNTER — Other Ambulatory Visit: Payer: Self-pay | Admitting: Cardiology

## 2015-05-12 NOTE — Progress Notes (Signed)
Cardiology Office Note   Date:  05/13/2015   ID:  Patrick Gilmore, DOB April 23, 1955, MRN [id number]  PCP:  Lottie Dawson, MD    Chief Complaint  Patient presents with  . Sleep Apnea  . Hypertension      History of Present Illness: Patrick Gilmore is a 60 y.o. male with a history of HTN, obesity, dyslipidemia and OSA who presents today for followup. He has not been tolerating the CPAP or the mask and so he was referred back for repeat sleep study which showed severe OSA with an AHI of 89/hr.He was placed back on CPAP but does not tolerate the full face mask because it moves around on his face and blows air. He feels the pressure is adequate.He goes to bed at 9:30am and gets up at 4-5am.  He feels rested in the am and has no daytime sleepiness.    Past Medical History  Diagnosis Date  . OSA (obstructive sleep apnea)     Could not tolerate CPAP now on oral device followed by Dr Toy Cookey  . Obesity   . Medically noncompliant   . Spastic colon   . Sternal mass 09/2011  . Peripheral neuropathy ([place]) 09/2011  . H/O cardiovascular stress test 2007    EF 62%, decrease anterioseptal egion uptake on rest images only  . Hypertension     Past Surgical History  Procedure Laterality Date  . Colonoscopy      normal 10/10 return 10/20     Current Outpatient Prescriptions  Medication Sig Dispense Refill  . cloNIDine (CATAPRES) 0.3 MG tablet Take 1 tablet (0.3 mg total) by mouth 2 (two) times daily. 60 tablet 2  . metoprolol succinate (TOPROL-XL) 50 MG 24 hr tablet TAKE 1 TABLET BY MOUTH DAILY. TAKE WITH OR IMMEDIATELY FOLLOWING A MEAL 30 tablet 5  . rosuvastatin (CRESTOR) 20 MG tablet TAKE 1 TABLET BY MOUTH EVERY DAY 30 tablet 2  . valsartan-hydrochlorothiazide (DIOVAN-HCT) 320-25 MG tablet TAKE 1 TABLET BY MOUTH DAILY. 90 tablet 0   No current facility-administered medications for this visit.    Allergies:   Norvasc    Social History:  The patient   reports that he has never smoked. He does not have any smokeless tobacco history on file. He reports that he drinks alcohol. He reports that he does not use illicit drugs.   Family History:  The patient's family history includes Breast cancer in his sister; Cancer in his mother; Kidney failure in his father.    ROS:  Please see the history of present illness.   Otherwise, review of systems are positive for none.   All other systems are reviewed and negative.    PHYSICAL EXAM: VS:  BP 122/88 mmHg  Pulse 79  Ht 5\' 11"  (1.803 m)  Wt 271 lb (122.925 kg)  BMI 37.81 kg/m2  SpO2 99% , BMI Body mass index is 37.81 kg/(m^2). GEN: Well nourished, well developed, in no acute distress HEENT: normal Neck: no JVD, carotid bruits, or masses Cardiac: RRR; no murmurs, rubs, or gallops,no edema  Respiratory:  clear to auscultation bilaterally, normal work of breathing GI: soft, nontender, nondistended, + BS MS: no deformity or atrophy Skin: warm and dry, no rash Neuro:  Strength and sensation are intact Psych: euthymic mood, full affect   EKG:  EKG is not ordered today.    Recent Labs: 07/16/2014: ALT 54*  Lipid Panel    Component Value Date/Time   CHOL 133 07/16/2014 0841   TRIG 122.0 07/16/2014 0841   HDL 39.20 07/16/2014 0841   CHOLHDL 3 07/16/2014 0841   VLDL 24.4 07/16/2014 0841   LDLCALC 69 07/16/2014 0841      Wt Readings from Last 3 Encounters:  05/13/15 271 lb (122.925 kg)  11/11/14 277 lb (125.646 kg)  07/15/14 277 lb 12.8 oz (126.009 kg)    ASSESSMENT AND PLAN:  1. HTN well controlled - continue Clonidine/Diovan HCT/Toprol  - check BMET  2. Obesity  - I have encouraged him to get into a more intense walking program and watching his portions. He has started a diet and lost 6lbs.   3. OSA on CPAP he tolerates the pressure but is not tolerating the mask.  His d/l today showed an AHI of 2.8/hr on 12cm H2O but only 11% compliance in using more than 4 hours nightly.  I  will order a nasal pillow mask and chin strap and repeat d/l in 4 weeks.  4. Dyslipidemia - LDL at goal at 69 - continue Crestor  - check FLP and ALT   Current medicines are reviewed at length with the patient today.  The patient does not have concerns regarding medicines.  The following changes have been made:  no change  Labs/ tests ordered today: See above Assessment and Plan No orders of the defined types were placed in this encounter.     Disposition:   FU with me in 1 year  Signed, Sueanne Margarita, MD  05/13/2015 8:30 AM    [address], [place], [place]  [address] Phone: [phone]; Fax: [phone]

## 2015-05-13 ENCOUNTER — Ambulatory Visit (INDEPENDENT_AMBULATORY_CARE_PROVIDER_SITE_OTHER): Payer: BLUE CROSS/BLUE SHIELD | Admitting: Cardiology

## 2015-05-13 ENCOUNTER — Encounter: Payer: Self-pay | Admitting: Cardiology

## 2015-05-13 VITALS — BP 122/88 | HR 79 | Ht 71.0 in | Wt 271.0 lb

## 2015-05-13 DIAGNOSIS — G4733 Obstructive sleep apnea (adult) (pediatric): Secondary | ICD-10-CM

## 2015-05-13 DIAGNOSIS — E669 Obesity, unspecified: Secondary | ICD-10-CM

## 2015-05-13 DIAGNOSIS — I10 Essential (primary) hypertension: Secondary | ICD-10-CM

## 2015-05-13 DIAGNOSIS — E785 Hyperlipidemia, unspecified: Secondary | ICD-10-CM | POA: Diagnosis not present

## 2015-05-13 LAB — HEPATIC FUNCTION PANEL
ALK PHOS: 38 U/L — AB (ref 40–115)
ALT: 54 U/L — ABNORMAL HIGH (ref 9–46)
AST: 26 U/L (ref 10–35)
Albumin: 3.9 g/dL (ref 3.6–5.1)
BILIRUBIN DIRECT: 0.1 mg/dL (ref <=0.2)
BILIRUBIN INDIRECT: 0.5 mg/dL (ref 0.2–1.2)
BILIRUBIN TOTAL: 0.6 mg/dL (ref 0.2–1.2)
Total Protein: 6.8 g/dL (ref 6.1–8.1)

## 2015-05-13 LAB — BASIC METABOLIC PANEL
BUN: 19 mg/dL (ref 7–25)
CHLORIDE: 103 mmol/L (ref 98–110)
CO2: 22 mmol/L (ref 20–31)
Calcium: 9.5 mg/dL (ref 8.6–10.3)
Creat: 1.17 mg/dL (ref 0.70–1.33)
Glucose, Bld: 111 mg/dL — ABNORMAL HIGH (ref 65–99)
POTASSIUM: 4.1 mmol/L (ref 3.5–5.3)
Sodium: 138 mmol/L (ref 135–146)

## 2015-05-13 LAB — LIPID PANEL
CHOL/HDL RATIO: 3.6 ratio (ref <=5.0)
CHOLESTEROL: 132 mg/dL (ref 125–200)
HDL: 37 mg/dL — ABNORMAL LOW (ref >=40)
LDL CALC: 58 mg/dL (ref <130)
Triglycerides: 184 mg/dL — ABNORMAL HIGH (ref <150)
VLDL: 37 mg/dL — AB (ref <30)

## 2015-05-13 NOTE — Patient Instructions (Signed)
Medication Instructions:  Your physician recommends that you continue on your current medications as directed. Please refer to the Current Medication list given to you today.   Labwork: TODAY: BMET, LFTs, Lipids  Testing/Procedures: None  Follow-Up: Your physician wants you to follow-up in: 1 year with Dr. Ostenson. You will receive a reminder letter in the mail two months in advance. If you don't receive a letter, please call our office to schedule the follow-up appointment.   Any Other Special Instructions Will Be Listed Below (If Applicable).     If you need a refill on your cardiac medications before your next appointment, please call your pharmacy.   

## 2015-05-18 ENCOUNTER — Other Ambulatory Visit: Payer: Self-pay | Admitting: *Deleted

## 2015-05-18 DIAGNOSIS — G4733 Obstructive sleep apnea (adult) (pediatric): Secondary | ICD-10-CM

## 2015-05-23 ENCOUNTER — Other Ambulatory Visit: Payer: Self-pay | Admitting: Cardiology

## 2015-05-25 ENCOUNTER — Telehealth: Payer: Self-pay | Admitting: Cardiology

## 2015-05-25 NOTE — Telephone Encounter (Signed)
New Message  Pt requesting to speak w/ RN concerning his order for a sleep mask. Please call back and discuss.

## 2015-05-26 NOTE — Telephone Encounter (Signed)
Left message for patient to call back  

## 2015-05-26 NOTE — Telephone Encounter (Signed)
Patient is aware that [facility] has the order

## 2015-06-09 DIAGNOSIS — G4733 Obstructive sleep apnea (adult) (pediatric): Secondary | ICD-10-CM | POA: Diagnosis not present

## 2015-07-13 DIAGNOSIS — B078 Other viral warts: Secondary | ICD-10-CM | POA: Diagnosis not present

## 2015-07-28 ENCOUNTER — Other Ambulatory Visit: Payer: Self-pay | Admitting: Cardiology

## 2015-10-28 ENCOUNTER — Other Ambulatory Visit: Payer: Self-pay | Admitting: *Deleted

## 2015-10-28 MED ORDER — CLONIDINE HCL 0.3 MG PO TABS: 0.3000 mg | ORAL_TABLET | Freq: Two times a day (BID) | ORAL | 2 refills | Status: DC

## 2015-10-28 MED ORDER — VALSARTAN-HYDROCHLOROTHIAZIDE 320-25 MG PO TABS: 1.0000 | ORAL_TABLET | Freq: Every day | ORAL | 2 refills | Status: DC

## 2015-10-28 MED ORDER — METOPROLOL SUCCINATE ER 50 MG PO TB24: ORAL_TABLET | ORAL | 2 refills | Status: DC

## 2015-11-06 ENCOUNTER — Ambulatory Visit (INDEPENDENT_AMBULATORY_CARE_PROVIDER_SITE_OTHER): Payer: BLUE CROSS/BLUE SHIELD | Admitting: Physician Assistant

## 2015-11-06 VITALS — BP 148/94 | HR 86 | Temp 97.5°F | Resp 18 | Ht 71.25 in | Wt 280.6 lb

## 2015-11-06 DIAGNOSIS — M5010 Cervical disc disorder with radiculopathy, unspecified cervical region: Secondary | ICD-10-CM | POA: Diagnosis not present

## 2015-11-06 MED ORDER — PREDNISONE 20 MG PO TABS: ORAL_TABLET | ORAL | 0 refills | Status: AC

## 2015-11-06 NOTE — Patient Instructions (Signed)
Okay to take tylenol with prednisone but nothing else.

## 2015-11-06 NOTE — Progress Notes (Signed)
11/06/2015 7:29 PM   DOB: March 02, 1955 / MRN: [id number]  SUBJECTIVE:  Patrick Gilmore is a 59 y.o. male presenting for right UE finger numbness and tingling.  He has a history cervical disk disease and had similar symptoms in 2011 treated successfully with oral prednisone, however it took two dose packs to ameliorate his symptoms.  He has no history of DM2 and was recently checked for this by his PCP.  He has tried OTC nsaids for about a week for this problem and has not improved. No bowel or bladder incontinence. No weakness or change in dexterity.   He is allergic to norvasc [amlodipine besylate].   He  has a past medical history of H/O cardiovascular stress test (2007); Hypertension; Medically noncompliant; Obesity; OSA (obstructive sleep apnea); Peripheral neuropathy ([place]) (09/2011); Spastic colon; and Sternal mass (09/2011).    He  reports that he has never smoked. He does not have any smokeless tobacco history on file. He reports that he drinks alcohol. He reports that he does not use drugs. He  has no sexual activity history on file. The patient  has a past surgical history that includes Colonoscopy.  His family history includes Breast cancer in his sister; Cancer in his mother; Kidney failure in his father.  Review of Systems  Musculoskeletal: Positive for neck pain. Negative for back pain, falls, joint pain and myalgias.  Neurological: Positive for tingling. Negative for tremors, speech change, focal weakness, seizures and loss of consciousness.    The problem list and medications were reviewed and updated by myself where necessary and exist elsewhere in the encounter.   OBJECTIVE:  BP (!) 148/94 (BP Location: Right Arm, Patient Position: Sitting, Cuff Size: Large)   Pulse 86   Temp 97.5 F (36.4 C)   Resp 18   Ht 5' 11.25" (1.81 m)   Wt 280 lb 9.6 oz (127.3 kg)   SpO2 97%   BMI 38.86 kg/m   Physical Exam  Constitutional: He is oriented to person, place, and time. He appears  well-developed and well-nourished. No distress.  Cardiovascular: Normal rate and regular rhythm.   Pulmonary/Chest: Effort normal and breath sounds normal.  Musculoskeletal: Normal range of motion. He exhibits no edema, tenderness or deformity.  Neurological: He is alert and oriented to person, place, and time. He has normal reflexes. He displays normal reflexes. No cranial nerve deficit. He exhibits normal muscle tone. Coordination normal.  Skin: He is not diaphoretic.  Psychiatric: He has a normal mood and affect.    No results found for this or any previous visit (from the past 72 hour(s)).  No results found.  ASSESSMENT AND PLAN  [name] was seen today for other.  Diagnoses and all orders for this visit:  Cervical disc disorder with radiculopathy of cervical region: He has no history of diabetes and is receiving regular screenings.  He has responded to prednisone in the past, however it took two dose packs.  I will try to prevent this with two days worth of 80 mg (he is a large person) and then a 60x3, 40x3, 20x3 taper. He will call if he is not better.  -     predniSONE (DELTASONE) 20 MG tablet; Take 4 on days one and two, then take 3 in the morning for 3 days, then 2 in the morning for 3 days, and then 1 in the morning for 3 days.    The patient is advised to call or return to clinic if he does  not see an improvement in symptoms, or to seek the care of the closest emergency department if he worsens with the above plan.   Philis Fendt, MHS, PA-C [facility] 11/06/2015 7:29 PM

## 2015-12-21 ENCOUNTER — Ambulatory Visit (INDEPENDENT_AMBULATORY_CARE_PROVIDER_SITE_OTHER): Payer: BLUE CROSS/BLUE SHIELD | Admitting: Physician Assistant

## 2015-12-21 ENCOUNTER — Ambulatory Visit (INDEPENDENT_AMBULATORY_CARE_PROVIDER_SITE_OTHER): Payer: BLUE CROSS/BLUE SHIELD

## 2015-12-21 ENCOUNTER — Encounter: Payer: Self-pay | Admitting: Physician Assistant

## 2015-12-21 VITALS — BP 128/80 | HR 100 | Temp 98.3°F | Resp 16 | Ht 71.0 in | Wt 277.0 lb

## 2015-12-21 DIAGNOSIS — M5010 Cervical disc disorder with radiculopathy, unspecified cervical region: Secondary | ICD-10-CM

## 2015-12-21 DIAGNOSIS — M50122 Cervical disc disorder at C5-C6 level with radiculopathy: Secondary | ICD-10-CM | POA: Diagnosis not present

## 2015-12-21 LAB — POCT GLYCOSYLATED HEMOGLOBIN (HGB A1C): HEMOGLOBIN A1C: 6.3

## 2015-12-21 MED ORDER — PREDNISONE 20 MG PO TABS: ORAL_TABLET | ORAL | 0 refills | Status: AC

## 2015-12-21 NOTE — Patient Instructions (Signed)
     IF you received an x-ray today, you will receive an invoice from [facility]. Please contact  Radiology at [phone] with questions or concerns regarding your invoice.   IF you received labwork today, you will receive an invoice from [facility]. Please contact [facility] at [phone] with questions or concerns regarding your invoice.   Our billing staff will not be able to assist you with questions regarding bills from these companies.  You will be contacted with the lab results as soon as they are available. The fastest way to get your results is to activate your My Chart account. Instructions are located on the last page of this paperwork. If you have not heard from us regarding the results in 2 weeks, please contact this office.      

## 2015-12-21 NOTE — Progress Notes (Addendum)
By signing my name below, I, [name], attest that this documentation has been prepared under the direction and in the presence of Philis Fendt, PA-C.  Electronically Signed: Thea Alken, ED Scribe. 12/21/2015. 4:09 PM.  12/21/2015 4:09 PM   DOB: [birth year] / MRN: [id number]  SUBJECTIVE:  Patrick Gilmore is a 60 y.o. male presenting for follow up. Pt was seen 9/8. Since last visit he's had worsening numbness and tingling that radiates down right arm and into right fingers. Symptoms are worse at night. Last visit he was treated with a 4 day course of prednisone taper. States symptoms had improved temporarily but has now returned. He denies CP and SOB.  He is allergic to norvasc [amlodipine besylate].   He  has a past medical history of H/O cardiovascular stress test (2007); Hypertension; Medically noncompliant; Obesity; OSA (obstructive sleep apnea); Peripheral neuropathy ([place]) (09/2011); Spastic colon; and Sternal mass (09/2011).    He  reports that he has never smoked. He has never used smokeless tobacco. He reports that he drinks alcohol. He reports that he does not use drugs. He  has no sexual activity history on file. The patient  has a past surgical history that includes Colonoscopy.  His family history includes Breast cancer in his sister; Cancer in his mother; Kidney failure in his father.  Review of Systems  Constitutional: Negative for fever.  Respiratory: Negative for shortness of breath.   Cardiovascular: Negative for chest pain.  Neurological: Positive for tingling.    The problem list and medications were reviewed and updated by myself where necessary and exist elsewhere in the encounter.   OBJECTIVE:  BP 128/80    Pulse 100    Temp 98.3 F (36.8 C) (Oral)    Resp 16    Ht 5\' 11"  (1.803 m)    Wt 277 lb (125.6 kg)    SpO2 97%    BMI 38.63 kg/m   BP recheck-128/80  Physical Exam  Constitutional: He is oriented to person, place, and time. He appears well-developed.    Cardiovascular: Normal rate, regular rhythm and normal heart sounds.   Pulmonary/Chest: Effort normal and breath sounds normal. No respiratory distress. He has no rales.  Musculoskeletal: Normal range of motion.  Neurological: He is alert and oriented to person, place, and time. He has normal strength. No cranial nerve deficit or sensory deficit. He displays a negative Romberg sign.  Reflex Scores:      Tricep reflexes are 2+ on the right side and 2+ on the left side.      Bicep reflexes are 2+ on the right side and 2+ on the left side.      Brachioradialis reflexes are 2+ on the right side and 2+ on the left side. Skin: Skin is warm and dry.  Psychiatric: He has a normal mood and affect. His behavior is normal. Judgment and thought content normal.    Dg Cervical Spine 2 Or 3 Views  Result Date: 12/21/2015 CLINICAL DATA:  Cervical disc disorder with radiculopathy of cervical region EXAM: CERVICAL SPINE - 2-3 VIEW COMPARISON:  None. FINDINGS: Normal alignment. Cervical kyphosis. Negative for fracture or mass. Mild disc degeneration C5-6 with mild spurring. C6-7 not well visualized due to overlying shoulders. IMPRESSION: Disc degeneration and spurring at C5-6.  C6-7 not well visualized. Electronically Signed   By: Franchot Gallo M.D.   On: 12/21/2015 15:18   Pulse Readings from Last 3 Encounters:  12/21/15 100  11/06/15 86  05/13/15 79  No results found for: HGBA1C  ASSESSMENT AND PLAN  Lanson was seen today for arm pain.  Diagnoses and all orders for this visit:  Cervical disc disorder with radiculopathy of cervical region: He reports the prednisone did alleviate his symptoms however he played gold and the symptoms came back and are now worse. His HR is up today however he feels fine and feels that this is due to fatigue as he did not sleep last night 2/2 to the pain.  Denies polyuria with pred.  Will screen for diabetes and restart his pred and get him established with orthopedics.    -     DG Cervical Spine 2 or 3 views; Future -     POCT glycosylated hemoglobin (Hb A1C) -     predniSONE (DELTASONE) 20 MG tablet; Take 3 in the morning for 3 days, then 2 in the morning for 3 days, and then 1 in the morning for 3 days.     This note was scribed in my presence and I performed the services described in the this documentation.   The patient is advised to call or return to clinic if he does not see an improvement in symptoms, or to seek the care of the closest emergency department if he worsens with the above plan.   Philis Fendt, MHS, PA-C [facility] 12/21/2015 4:09 PM

## 2015-12-31 DIAGNOSIS — Z6838 Body mass index (BMI) 38.0-38.9, adult: Secondary | ICD-10-CM | POA: Diagnosis not present

## 2015-12-31 DIAGNOSIS — M5412 Radiculopathy, cervical region: Secondary | ICD-10-CM | POA: Diagnosis not present

## 2016-01-08 ENCOUNTER — Other Ambulatory Visit: Payer: Self-pay | Admitting: Neurosurgery

## 2016-01-08 DIAGNOSIS — M5412 Radiculopathy, cervical region: Secondary | ICD-10-CM

## 2016-01-20 ENCOUNTER — Other Ambulatory Visit: Payer: BLUE CROSS/BLUE SHIELD

## 2016-02-02 ENCOUNTER — Telehealth: Payer: Self-pay | Admitting: Cardiology

## 2016-02-02 NOTE — Telephone Encounter (Signed)
New Message  Pt voiced he would like an appt sooner than March due to his insurance.  Offered PA/APP and pt declined but he would like to know if the nurse can work him in to see MD-Achee.  Please f/u with pt

## 2016-02-02 NOTE — Telephone Encounter (Signed)
Left message to call back  

## 2016-02-08 NOTE — Telephone Encounter (Signed)
Patrick Gilmore is returning your call . Please call on [phone] .Marland Kitchen Thanks

## 2016-02-08 NOTE — Telephone Encounter (Signed)
Left message for patient to call back and reschedule appointment if he so chooses- that there are late January appointments available.

## 2016-04-05 ENCOUNTER — Other Ambulatory Visit: Payer: Self-pay | Admitting: Cardiology

## 2016-04-06 DIAGNOSIS — Z23 Encounter for immunization: Secondary | ICD-10-CM | POA: Diagnosis not present

## 2016-05-12 ENCOUNTER — Ambulatory Visit: Payer: Self-pay | Admitting: Cardiology

## 2016-05-25 ENCOUNTER — Encounter: Payer: Self-pay | Admitting: Gastroenterology

## 2016-05-25 ENCOUNTER — Encounter (INDEPENDENT_AMBULATORY_CARE_PROVIDER_SITE_OTHER): Payer: Self-pay

## 2016-05-25 ENCOUNTER — Ambulatory Visit (INDEPENDENT_AMBULATORY_CARE_PROVIDER_SITE_OTHER): Payer: BLUE CROSS/BLUE SHIELD | Admitting: Gastroenterology

## 2016-05-25 VITALS — BP 130/80 | HR 80 | Ht 71.0 in | Wt 275.4 lb

## 2016-05-25 DIAGNOSIS — R29898 Other symptoms and signs involving the musculoskeletal system: Secondary | ICD-10-CM | POA: Diagnosis not present

## 2016-05-25 NOTE — Patient Instructions (Signed)
We have printed a copy of your MRI and gave to you today   Schedule an appointment with a Primary Care doctor

## 2016-05-25 NOTE — Progress Notes (Addendum)
05/25/2016 Patrick Gilmore [id number] 09-Jan-1956   HISTORY OF PRESENT ILLNESS:  This is a 61 year old male who is known to Dr. Carlean Purl for colonoscopy.  He presents to our office today with complaints of a "bulging knot" at the top of his stomach.  He says that this has been present for quite some time but has gotten worse. He actually had an MRI of the chest in August 2013 for evaluation of this at which time it showed the distal xiphoid process is bifid and angles anteriorly in the distal most portion accounting for the palpable finding/protrusion that was to be evaluated. It reported no definite subxiphoid herniation or abnormality of the adjacent rectus musculature and no worrisome findings in this facility. The patient denies any other GI symptoms of an occasional heartburn/reflux.   Past Medical History:  Diagnosis Date  . H/O cardiovascular stress test 2007   EF 62%, decrease anterioseptal egion uptake on rest images only  . Hypertension   . Medically noncompliant   . Obesity   . OSA (obstructive sleep apnea)    Could not tolerate CPAP now on oral device followed by Dr Toy Cookey  . Peripheral neuropathy ([place]) 09/2011  . Spastic colon   . Sternal mass 09/2011   Past Surgical History:  Procedure Laterality Date  . COLONOSCOPY     normal 10/10 return 10/20  . [place]    reports that he has never smoked. He has never used smokeless tobacco. He reports that he drinks alcohol. He reports that he does not use drugs. family history includes Breast cancer in his mother and sister; Colon polyps in his brother and mother; Diabetes in his brother; Hypertension in his brother; Kidney failure in his father. Allergies  Allergen Reactions  . Norvasc [Amlodipine Besylate]     Headaches      Outpatient Encounter Prescriptions as of 05/25/2016  Medication Sig  . cloNIDine (CATAPRES) 0.3 MG tablet Take 1 tablet (0.3 mg total) by mouth 2 (two) times daily.  . metoprolol  succinate (TOPROL-XL) 50 MG 24 hr tablet TAKE 1 TABLET BY MOUTH DAILY. TAKE WITH OR IMMEDIATELY FOLLOWING A MEAL  . rosuvastatin (CRESTOR) 20 MG tablet TAKE 1 TABLET BY MOUTH EVERY DAY  . valsartan-hydrochlorothiazide (DIOVAN-HCT) 320-25 MG tablet Take 1 tablet by mouth daily.   No facility-administered encounter medications on file as of 05/25/2016.      REVIEW OF SYSTEMS  : All other systems reviewed and negative except where noted in the History of Present Illness.   PHYSICAL EXAM: BP 130/80   Pulse 80   Ht 5\' 11"  (1.803 m)   Wt 275 lb 6 oz (124.9 kg)   BMI 38.41 kg/m  General: Well developed white male in no acute distress Head: Normocephalic and atraumatic Eyes:  Sclerae anicteric, conjunctiva pink. Ears: Normal auditory acuity Lungs: Clear throughout to auscultation; no increased WOB.  Prominent protrusion of xiphoid process. Heart: Regular rate and rhythm Abdomen: Soft, non-distended. Normal bowel sounds.  Non-tender. Musculoskeletal: Symmetrical with no gross deformities  Skin: No lesions on visible extremities Extremities: No edema  Neurological: Alert oriented x 4, grossly non-focal Psychological:  Alert and cooperative. Normal mood and affect  ASSESSMENT AND PLAN: -Protrusion at inferior portion of his sternum:  This is previously demonstrated as an anteriorly angled xiphoid process on MRI chest in 2013.  This is not an abdominal bulge or issue.  He was provided with a copy of his MRI results and  will follow-up with a PCP if he has further concerns.  CC:  Shamleffer, Ibethal Jar*  Agree with Ms. Zehr's management.  Gatha Mayer, MD, [facility]

## 2016-06-03 DIAGNOSIS — R0789 Other chest pain: Secondary | ICD-10-CM | POA: Diagnosis not present

## 2016-06-03 DIAGNOSIS — R29898 Other symptoms and signs involving the musculoskeletal system: Secondary | ICD-10-CM | POA: Diagnosis not present

## 2016-06-05 ENCOUNTER — Other Ambulatory Visit: Payer: Self-pay | Admitting: Cardiology

## 2016-06-06 ENCOUNTER — Encounter: Payer: Self-pay | Admitting: Cardiology

## 2016-06-22 ENCOUNTER — Telehealth: Payer: Self-pay | Admitting: *Deleted

## 2016-06-22 ENCOUNTER — Encounter (INDEPENDENT_AMBULATORY_CARE_PROVIDER_SITE_OTHER): Payer: Self-pay

## 2016-06-22 ENCOUNTER — Encounter: Payer: Self-pay | Admitting: Cardiology

## 2016-06-22 ENCOUNTER — Ambulatory Visit (INDEPENDENT_AMBULATORY_CARE_PROVIDER_SITE_OTHER): Payer: BLUE CROSS/BLUE SHIELD | Admitting: Cardiology

## 2016-06-22 ENCOUNTER — Other Ambulatory Visit: Payer: Self-pay | Admitting: Family Medicine

## 2016-06-22 VITALS — BP 126/96 | HR 75 | Ht 71.0 in | Wt 272.0 lb

## 2016-06-22 DIAGNOSIS — G4733 Obstructive sleep apnea (adult) (pediatric): Secondary | ICD-10-CM | POA: Diagnosis not present

## 2016-06-22 DIAGNOSIS — E669 Obesity, unspecified: Secondary | ICD-10-CM | POA: Diagnosis not present

## 2016-06-22 DIAGNOSIS — I10 Essential (primary) hypertension: Secondary | ICD-10-CM

## 2016-06-22 DIAGNOSIS — E785 Hyperlipidemia, unspecified: Secondary | ICD-10-CM

## 2016-06-22 DIAGNOSIS — R29898 Other symptoms and signs involving the musculoskeletal system: Secondary | ICD-10-CM

## 2016-06-22 LAB — HEPATIC FUNCTION PANEL
ALBUMIN: 4.3 g/dL (ref 3.5–5.5)
ALT: 45 IU/L — ABNORMAL HIGH (ref 0–44)
AST: 23 IU/L (ref 0–40)
Alkaline Phosphatase: 44 IU/L (ref 39–117)
Bilirubin Total: 0.6 mg/dL (ref 0.0–1.2)
Bilirubin, Direct: 0.17 mg/dL (ref 0.00–0.40)
Total Protein: 6.8 g/dL (ref 6.0–8.5)

## 2016-06-22 LAB — BASIC METABOLIC PANEL
BUN / CREAT RATIO: 16 (ref 9–20)
BUN: 17 mg/dL (ref 6–24)
CALCIUM: 9.6 mg/dL (ref 8.7–10.2)
CHLORIDE: 97 mmol/L (ref 96–106)
CO2: 25 mmol/L (ref 18–29)
Creatinine, Ser: 1.05 mg/dL (ref 0.76–1.27)
GFR calc non Af Amer: 77 mL/min/{1.73_m2} (ref >59)
GFR, EST AFRICAN AMERICAN: 89 mL/min/{1.73_m2} (ref >59)
GLUCOSE: 123 mg/dL — AB (ref 65–99)
POTASSIUM: 4.3 mmol/L (ref 3.5–5.2)
Sodium: 139 mmol/L (ref 134–144)

## 2016-06-22 LAB — LIPID PANEL
CHOL/HDL RATIO: 3.2 ratio (ref 0.0–5.0)
Cholesterol, Total: 140 mg/dL (ref 100–199)
HDL: 44 mg/dL (ref >39)
LDL Calculated: 62 mg/dL (ref 0–99)
TRIGLYCERIDES: 170 mg/dL — AB (ref 0–149)
VLDL Cholesterol Cal: 34 mg/dL (ref 5–40)

## 2016-06-22 MED ORDER — METOPROLOL SUCCINATE ER 50 MG PO TB24: ORAL_TABLET | ORAL | 3 refills | Status: DC

## 2016-06-22 MED ORDER — ROSUVASTATIN CALCIUM 20 MG PO TABS: 20.0000 mg | ORAL_TABLET | Freq: Every day | ORAL | 3 refills | Status: DC

## 2016-06-22 MED ORDER — CLONIDINE HCL 0.1 MG PO TABS: 0.2000 mg | ORAL_TABLET | Freq: Two times a day (BID) | ORAL | 0 refills | Status: DC

## 2016-06-22 NOTE — Telephone Encounter (Signed)
Called [facility] on [address] spoke to Jellico and asked if they were able to get a download on the the patients machine. Jeneen Rinks informed me that he did not have any record of [place] getting a download this morning for this patient.

## 2016-06-22 NOTE — Progress Notes (Signed)
Cardiology Office Note    Date:  06/22/2016   ID:  Patrick Gilmore, DOB 04/19/1955, MRN [id number]  PCP:  [facility]  Cardiologist:  Fransico Him, MD   Chief Complaint  Patient presents with  . Sleep Apnea  . Hypertension  . Hyperlipidemia    History of Present Illness:  Patrick Gilmore is a 61 y.o. male with a history of HTN, obesity, dyslipidemia and OSA who presents today for followup. He has severe OSA with an AHI of 89/hr and is on CPAP  He alternates between the full face and nasal pillow mask which he tolerates well and feels the pressure is adequate.  He continues to feel rested in the am and has no daytime sleepiness. He has a lot of   Mouth dryness due to the clonidine but no nasal dryness.  He tolerates his medications and is compliant with his medications.  He denies any chest pain or pressure, SOB, DOE, LE edema, dizziness, palpitations or syncope.    Past Medical History:  Diagnosis Date  . H/O cardiovascular stress test 2007   EF 62%, decrease anterioseptal egion uptake on rest images only  . Hypertension   . Medically noncompliant   . Obesity   . OSA (obstructive sleep apnea)    Could not tolerate CPAP now on oral device followed by Dr Toy Cookey  . Peripheral neuropathy 09/2011  . Spastic colon   . Sternal mass 09/2011    Past Surgical History:  Procedure Laterality Date  . COLONOSCOPY     normal 10/10 return 10/20  . SHOULDER SURGERY  1978    Current Medications: Current Meds  Medication Sig  . cloNIDine (CATAPRES) 0.3 MG tablet Take 1 tablet (0.3 mg total) by mouth 2 (two) times daily.  . metoprolol succinate (TOPROL-XL) 50 MG 24 hr tablet TAKE 1 TABLET BY MOUTH DAILY. TAKE WITH OR IMMEDIATELY FOLLOWING A MEAL  . rosuvastatin (CRESTOR) 20 MG tablet TAKE 1 TABLET BY MOUTH EVERY DAY  . valsartan-hydrochlorothiazide (DIOVAN-HCT) 320-25 MG tablet Take 1 tablet by mouth daily.    Allergies:   Norvasc [amlodipine besylate]    Social History   Social History  . Marital status: Married    Spouse name: leslie  . Number of children: 2  . Years of education: college   Occupational History  . self employed    Social History Main Topics  . Smoking status: Never Smoker  . Smokeless tobacco: Never Used  . Alcohol use Yes     Comment: occassional wine  . Drug use: No  . Sexual activity: Not Asked   Other Topics Concern  . None   Social History Narrative  . None     Family History:  The patient's family history includes Breast cancer in his mother and sister; Colon polyps in his brother and mother; Diabetes in his brother; Hypertension in his brother; Kidney failure in his father.   ROS:   Please see the history of present illness.    ROS All other systems reviewed and are negative.  No flowsheet data found.     PHYSICAL EXAM:   VS:  BP (!) 126/96   Pulse 75   Ht 5\' 11"  (1.803 m)   Wt 272 lb (123.4 kg)   BMI 37.94 kg/m    GEN: Well nourished, well developed, in no acute distress  HEENT: normal  Neck: no JVD, carotid bruits, or masses Cardiac: RRR; no murmurs, rubs, or gallops,no edema.  Intact distal pulses bilaterally.  Respiratory:  clear to auscultation bilaterally, normal work of breathing GI: soft, nontender, nondistended, + BS MS: no deformity or atrophy  Skin: warm and dry, no rash Neuro:  Alert and Oriented x 3, Strength and sensation are intact Psych: euthymic mood, full affect  Wt Readings from Last 3 Encounters:  06/22/16 272 lb (123.4 kg)  05/25/16 275 lb 6 oz (124.9 kg)  12/21/15 277 lb (125.6 kg)      Studies/Labs Reviewed:   EKG:  EKG is not ordered today.    Recent Labs: No results found for requested labs within last 8760 hours.   Lipid Panel    Component Value Date/Time   CHOL 132 05/13/2015 0844   TRIG 184 (H) 05/13/2015 0844   HDL 37 (L) 05/13/2015 0844   CHOLHDL 3.6 05/13/2015 0844   VLDL 37 (H) 05/13/2015 0844   LDLCALC 58 05/13/2015 0844     Additional studies/ records that were reviewed today include:  CPAP download    ASSESSMENT:    1. OSA (obstructive sleep apnea)   2. Benign essential HTN   3. Obesity (BMI 30-39.9)   4. Dyslipidemia      PLAN:  In order of problems listed above:  OSA - the patient is tolerating PAP therapy well without any problems. The patient has been using and benefiting from CPAP use and will continue to benefit from therapy. I will get a download from the DME. HTN - his BP is adequately controlled on current meds.  He will continue on ARB, diuretic and BB.  He is having a lot of problems with dry mouth from the Clonidine.  I will decrease the clonidine to 0.2mg  BID and increase his Toprol 75mg  daily.  I will have him followup in HTN clinic in 1 week to continue to wean off Clonidine.  We can titrate the BB as high as HR tolerates and may have to add on Doxazosin.  The goal will be to get him completely off Clonidine.  I have encouraged him to try the biotene mouth wash.   Obesity - I have encouraged him to get into a routine exercise program and cut back on carbs and portions.  4.   Hyperlipidemia - His LDL a year ago was 58.  I will repeat an FLP and ALT.  He will continue on statin.     Medication Adjustments/Labs and Tests Ordered: Current medicines are reviewed at length with the patient today.  Concerns regarding medicines are outlined above.  Medication changes, Labs and Tests ordered today are listed in the Patient Instructions below.  There are no Patient Instructions on file for this visit.   Signed, Fransico Him, MD  06/22/2016 8:45 AM    [facility], [place], [place]  [address] Phone: [phone]; Fax: [phone]

## 2016-06-22 NOTE — Patient Instructions (Addendum)
Medication Instructions:  1) DECREASE CLONIDINE to 0.2 mg twice daily (this will be 2 tablets twice daily). This will be decreased at your HTN Clinic appointment next week. 2) INCREASE TOPROL to 75 mg daily  Labwork: TODAY: BMET, LFTs, Lipids  Testing/Procedures: None  Follow-Up: You have an appointment scheduled in the HTN CLINIC  Monday, Jul 04, 2016 at 8:30AM. Please arrive at 8:15AM for this appointment.  Your physician wants you to follow-up in: 6 months with Dr. Radford Pax. You will receive a reminder letter in the mail two months in advance. If you don't receive a letter, please call our office to schedule the follow-up appointment.   Any Other Special Instructions Will Be Listed Below (If Applicable).     If you need a refill on your cardiac medications before your next appointment, please call your pharmacy.

## 2016-06-22 NOTE — Telephone Encounter (Signed)
Patient came into the office today for an appointment with Dr. Radford Pax and he brought his cpap machine with him for our office to get a download. I checked the machine for the chip but there was not a chip in the machine. Called [facility] to see how to go about getting a download from his cpap machine. [facility] suggested the patient take his machine to the retail store on [address] and have them insert a chip in his machine pull a download and have them fax the download to the cpap assistant Gae Bon) at fax [phone].  Carmen at [facility] said she would mail the patient a new chip for his machine. Asencion Partridge asked me to show the patient where the chip should be inserted in his machine. I showed the patient the small bottom left slot and explained your chip goes here. Patient understands to take his machine to [facility] on [address] to get a Banker. Patients understands to have [facility] fax the download to the cpap assistant at [phone]. Patient understands [place] will mail him a new chip for his machine. Patient understands where to insert the chip in his machine. Awaiting the download from [facility].

## 2016-06-22 NOTE — Telephone Encounter (Addendum)
Patient called back to say he went to [facility] this morning  after leaving our office and [facility] informed him that they could not get a download on his cpap. He was told to wait until his new chip came in the mail, insert the chip in his machine and then bring his machine back to [facility] and at that time they would get a download to send to our office.

## 2016-06-22 NOTE — Telephone Encounter (Signed)
Called the patient on his cell LMTCB.

## 2016-06-24 ENCOUNTER — Ambulatory Visit
Admission: RE | Admit: 2016-06-24 | Discharge: 2016-06-24 | Disposition: A | Discharge status: Home / Self Care | Payer: BLUE CROSS/BLUE SHIELD | Source: Ambulatory Visit | Attending: Family Medicine | Admitting: Family Medicine

## 2016-06-24 DIAGNOSIS — R29898 Other symptoms and signs involving the musculoskeletal system: Secondary | ICD-10-CM

## 2016-06-24 DIAGNOSIS — R918 Other nonspecific abnormal finding of lung field: Secondary | ICD-10-CM | POA: Diagnosis not present

## 2016-06-27 ENCOUNTER — Encounter: Payer: Self-pay | Admitting: Cardiology

## 2016-07-01 DIAGNOSIS — I712 Thoracic aortic aneurysm, without rupture, unspecified: Secondary | ICD-10-CM | POA: Insufficient documentation

## 2016-07-01 DIAGNOSIS — E7800 Pure hypercholesterolemia, unspecified: Secondary | ICD-10-CM | POA: Insufficient documentation

## 2016-07-03 ENCOUNTER — Encounter: Payer: Self-pay | Admitting: Gastroenterology

## 2016-07-04 ENCOUNTER — Encounter: Payer: Self-pay | Admitting: Pharmacist

## 2016-07-04 ENCOUNTER — Ambulatory Visit (INDEPENDENT_AMBULATORY_CARE_PROVIDER_SITE_OTHER): Payer: BLUE CROSS/BLUE SHIELD | Admitting: Pharmacist

## 2016-07-04 VITALS — BP 148/88 | HR 85

## 2016-07-04 DIAGNOSIS — I10 Essential (primary) hypertension: Secondary | ICD-10-CM | POA: Diagnosis not present

## 2016-07-04 MED ORDER — CARVEDILOL 12.5 MG PO TABS: 12.5000 mg | ORAL_TABLET | Freq: Two times a day (BID) | ORAL | 11 refills | Status: DC

## 2016-07-04 NOTE — Patient Instructions (Addendum)
It was great seeing you today!  Stop taking metoprolol. Start taking carvedilol 12.5mg  twice a day.  Decrease your clonidine to 0.1mg  (1 tablet) twice a day for 2 weeks. Then, cut back your clonidine to 0.1mg  (1 tablet) once a day for 1 week. Then stop.  Follow up in 1 month.

## 2016-07-04 NOTE — Progress Notes (Signed)
Patient ID: Patrick Gilmore                 DOB: [birth year]                      MRN: [id number]     HPI: Patrick Gilmore is a 61 y.o. male referred by Dr. Radford Pax to HTN clinic. Pt has past medical history of HTN, obesity, dyslipidemia and OSA. At last visit with Dr. Radford Pax on 06/22/2016 he complained of mouth dryness due to clonidine, but denied any other symptoms. His BP has been adequately controlled. With anticholinergic symptoms clonidine was weaned from 0.3mg  BID to 0.2mg  BID and Toprol XL was increased to 75mg  daily. Pt presents for continued clonidine wean and HTN management.  Pt presents today after a golf trip over the weekend in good spirits with no complaints. He states that the mouth dryness may be a little better on lower dose of clonidine, but that it is still bothersome. He also notices himself getting a headache if he misses his clonidine. Confirmed that patient does have an intolerance to amlodipine.  Current HTN meds:  Clonidine 0.2mg  BID Toprol XL 75mg  daily Valsartan/HCTZ 320-25mg  daily  Previously tried: amlodipine (headaches) BP goal: < 130/65mmHg  Family History: The patient's family history includes: Breast cancer in his mother and sister; Colon polyps in his brother and mother; Diabetes in his brother; Hypertension in his brother; Kidney failure in his father.   Social History: Married, with two children. Never smoked, does use alcohol (occasional wine and beers on golf trips).  Diet: cutting back on carbs as much as possible, usually eats in, 2 cups of coffee in the morning, lemonades and sweet teas sometimes  Exercise: walking (5,000 steps a day), golfing when he can  Home BP readings: not monitoring at home  Wt Readings from Last 3 Encounters:  06/22/16 272 lb (123.4 kg)  05/25/16 275 lb 6 oz (124.9 kg)  12/21/15 277 lb (125.6 kg)   BP Readings from Last 3 Encounters:  06/22/16 (!) 126/96  05/25/16 130/80  12/21/15 128/80   Pulse Readings from Last 3  Encounters:  06/22/16 75  05/25/16 80  12/21/15 100    Renal function: Estimated Creatinine Clearance: 101.3 mL/min (by C-G formula based on SCr of 1.05 mg/dL).  Past Medical History:  Diagnosis Date  . H/O cardiovascular stress test 2007   EF 62%, decrease anterioseptal egion uptake on rest images only  . Hypertension   . Medically noncompliant   . Obesity   . OSA (obstructive sleep apnea)    Could not tolerate CPAP now on oral device followed by Dr Toy Cookey  . Peripheral neuropathy 09/2011  . Spastic colon   . Xiphoid prominence 09/2011    Current Outpatient Prescriptions on File Prior to Visit  Medication Sig Dispense Refill  . cloNIDine (CATAPRES) 0.1 MG tablet Take 2 tablets (0.2 mg total) by mouth 2 (two) times daily. 240 tablet 0  . metoprolol succinate (TOPROL-XL) 50 MG 24 hr tablet Take 75mg  (1.5 tablets) daily. 135 tablet 3  . rosuvastatin (CRESTOR) 20 MG tablet Take 1 tablet (20 mg total) by mouth daily. 90 tablet 3  . valsartan-hydrochlorothiazide (DIOVAN-HCT) 320-25 MG tablet Take 1 tablet by mouth daily. 90 tablet 2   No current facility-administered medications on file prior to visit.     Allergies  Allergen Reactions  . Norvasc [Amlodipine Besylate]     Headaches     Assessment/Plan:  1.  Hypertension - Blood pressure currently above goal < 130/63mmHg and pt still symptomatic on clonidine. Will continue clonidine wean and decrease dose from 0.2mg  BID to 0.1mg  BID. Instructed patient to do this for 2 weeks, and then to decrease dose to 0.1mg  once day for 1 week, then stop. Will also change Toprol XL 75mg  daily to carvedilol 12.5mg  BID given its better antihypertensive profile. Pt would like to optimize medications before adding an additional agent. Will follow up in 1 month after clonidine wean is complete.   Melburn Popper, PharmD Clinical Pharmacy Resident 07/04/16 9:17 AM   Megan E. Supple, PharmD, CPP, [facility] [address], [place], [place] [address] Phone: [phone]; Fax: [phone]

## 2016-07-06 DIAGNOSIS — I712 Thoracic aortic aneurysm, without rupture: Secondary | ICD-10-CM | POA: Diagnosis not present

## 2016-07-07 ENCOUNTER — Ambulatory Visit: Payer: Self-pay | Admitting: Internal Medicine

## 2016-08-01 ENCOUNTER — Ambulatory Visit (INDEPENDENT_AMBULATORY_CARE_PROVIDER_SITE_OTHER): Payer: BLUE CROSS/BLUE SHIELD | Admitting: Pharmacist

## 2016-08-01 VITALS — BP 142/92 | HR 78

## 2016-08-01 DIAGNOSIS — I10 Essential (primary) hypertension: Secondary | ICD-10-CM | POA: Diagnosis not present

## 2016-08-01 MED ORDER — CARVEDILOL 25 MG PO TABS: 25.0000 mg | ORAL_TABLET | Freq: Two times a day (BID) | ORAL | 11 refills | Status: DC

## 2016-08-01 NOTE — Patient Instructions (Signed)
Increase your carvedilol to 25mg  twice a day  Continue taking valsartan-HCTZ 320-25mg  once a day  Blood pressure goal is < 130/80  Follow up in clinic in 4 weeks

## 2016-08-01 NOTE — Progress Notes (Signed)
Patient ID: Patrick Gilmore                 DOB: 1956/01/10                      MRN: [id number]     HPI: Patrick Gilmore is a 61 y.o. male referred by Dr. Radford Pax to HTN clinic who presents today for follow up. PMH is significant for HTN, obesity, dyslipidemia and OSA. At last visit with Dr. Radford Pax on 06/22/2016 he complained of mouth dryness due to clonidine, but denied any other symptoms. His BP had been adequately controlled. With anticholinergic symptoms clonidine was weaned from 0.3mg  BID to 0.2mg  BID and Toprol XL was increased to 75mg  daily. Pt still complained of adverse effects with clonidine. His Toprol was switched to carvedilol for better BP lowering and his clonidine was tapered off.  Pt reports feeling a bit better since stopping clonidine, states his dry mouth has resolved. He is a little tired today however attributes this to playing 3 rounds of golf yesterday. 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 He does not check his BP at home. Drank coffee this AM about 45 minutes ago. Took his AM dose of valsartan-HCTZ however needs to pick up his refill of carvedilol.  Current HTN meds: carvedilol 12.5mg  BID, valsartan-HCTZ 320-25mg  daily Previously tried: amlodipine (headaches), clonidine - dry mouth BP goal: < 130/23mmHg  Family History: The patient's family history includes: Breast cancer in his mother and sister; Colon polyps in his brother and mother; Diabetes in his brother; Hypertension in his brother; Kidney failure in his father.  Social History: Married, with two children. Never smoked, does use alcohol (occasional wine and beers on golf trips).  Diet: cutting back on carbs as much as possible, usually eats in, 2 cups of coffee in the morning, lemonades and sweet teas sometimes  Exercise: walking (5,000 steps a day), golfing when he can  Home BP readings: not monitoring at home  Wt Readings from Last 3 Encounters:  06/22/16 272 lb (123.4 kg)  05/25/16 275  lb 6 oz (124.9 kg)  12/21/15 277 lb (125.6 kg)   BP Readings from Last 3 Encounters:  07/04/16 (!) 148/88  06/22/16 (!) 126/96  05/25/16 130/80   Pulse Readings from Last 3 Encounters:  07/04/16 85  06/22/16 75  05/25/16 80    Renal function: CrCl cannot be calculated (Patient's most recent lab result is older than the maximum 21 days allowed.).  Past Medical History:  Diagnosis Date  . H/O cardiovascular stress test 2007   EF 62%, decrease anterioseptal egion uptake on rest images only  . Hypertension   . Medically noncompliant   . Obesity   . OSA (obstructive sleep apnea)    Could not tolerate CPAP now on oral device followed by Dr Toy Cookey  . Peripheral neuropathy 09/2011  . Spastic colon   . Xiphoid prominence 09/2011    Current Outpatient Prescriptions on File Prior to Visit  Medication Sig Dispense Refill  . carvedilol (COREG) 12.5 MG tablet Take 1 tablet (12.5 mg total) by mouth 2 (two) times daily. 60 tablet 11  . cloNIDine (CATAPRES) 0.1 MG tablet Take 1 tablet (0.1 mg total) by mouth 2 (two) times daily. 120 tablet 0  . rosuvastatin (CRESTOR) 20 MG tablet Take 1 tablet (20 mg total) by mouth daily. 90 tablet 3  . valsartan-hydrochlorothiazide (DIOVAN-HCT) 320-25 MG tablet Take 1 tablet by mouth daily. [place]  tablet 2   No current facility-administered medications on file prior to visit.     Allergies  Allergen Reactions  . Norvasc [Amlodipine Besylate]     Headaches     Assessment/Plan:  1. Hypertension - BP still above goal < 130/37mmHg however slightly improved since weaning off clonidine and changing from metoprolol to carvedilol. Will increase carvedilol to 25mg  BID and continue valsartan-HCTZ 320-25mg  daily. Pt hesitant to start brand new medication today, however may need to consider addition of spironolactone in the future if BP still elevated. F/u in HTN clinic in 4 weeks.   Patrick Gilmore, PharmD, CPP, [place] [address], [place], [place] [address] Phone: [phone]; Fax: [phone] 08/01/2016 8:49 AM

## 2016-08-14 ENCOUNTER — Other Ambulatory Visit: Payer: Self-pay | Admitting: Cardiology

## 2016-08-25 NOTE — Telephone Encounter (Signed)
Called LMTCB. 

## 2016-08-29 ENCOUNTER — Ambulatory Visit (INDEPENDENT_AMBULATORY_CARE_PROVIDER_SITE_OTHER): Payer: BLUE CROSS/BLUE SHIELD | Admitting: Pharmacist

## 2016-08-29 VITALS — BP 120/82 | HR 70

## 2016-08-29 DIAGNOSIS — I10 Essential (primary) hypertension: Secondary | ICD-10-CM | POA: Diagnosis not present

## 2016-08-29 MED ORDER — CARVEDILOL 12.5 MG PO TABS: 12.5000 mg | ORAL_TABLET | Freq: Two times a day (BID) | ORAL | 3 refills | Status: DC

## 2016-08-29 NOTE — Progress Notes (Signed)
Patient ID: MURRY DIAZ                 DOB: 18-Apr-1955                      MRN: [id number]     HPI: Patrick Gilmore is a 61 y.o. male referred by Dr. Radford Pax to HTN clinic who presents today for follow up. PMH is significant for HTN, obesity, dyslipidemia and OSA. At last visit with Dr. Radford Pax on 06/22/2016 he complained of mouth dryness due to clonidine, but denied any other symptoms. His BP had been adequately controlled. His Toprol was switched to carvedilol for better BP lowering and his clonidine was tapered off. At most recent visit, carvedilol was increased to 25mg  BID and pt was hesitant to start any new medications. He presents today for follow up.  Pt reports feeling well overall. He denies dizziness, blurred vision, or falls. He did not tolerate his higher dose of carvedilol 25mg  BID. He reported feeling spastic and jittery with occasional headaches. He started cutting his tablet in half after a week on the 25mg  BID dosing. He has been taking 12.5mg  for the past 3 weeks and tolerating this well.  Pt did not drink any coffee this morning. He took his BP medication 2 hours ago. States he thinks his BP was elevated at his last check because he had coffee that morning and drank wine the evening before. He reports his home cuff readings are mostly in the 140/90s but he does not think they are correct. He checked his BP at his mother's house and his readings were much better.  Current HTN meds:carvedilol 12.5mg  BID, valsartan-HCTZ 320-25mg  daily Previously tried:amlodipine - headaches, clonidine - dry mouth, carvedilol 25mg  BID - felt spastic, jittery, and had a headache BP goal: < 130/37mmHg  Family History: The patient's family history includes:Breast cancer in his mother and sister; Colon polyps in his brother and mother; Diabetes in his brother; Hypertension in his brother; Kidney failure in his father.  Social History: Married, with two children. Never smoked, does use alcohol  (occasional wine and beers on golf trips).  Diet:cutting back on carbs as much as possible, usually eats in, 2 cups of coffee in the morning, lemonades and sweet teas sometimes  Exercise:walking (5,000 steps a day), golfing when he can   Wt Readings from Last 3 Encounters:  06/22/16 272 lb (123.4 kg)  05/25/16 275 lb 6 oz (124.9 kg)  12/21/15 277 lb (125.6 kg)   BP Readings from Last 3 Encounters:  08/01/16 (!) 142/92  07/04/16 (!) 148/88  06/22/16 (!) 126/96   Pulse Readings from Last 3 Encounters:  08/01/16 78  07/04/16 85  06/22/16 75    Renal function: CrCl cannot be calculated (Patient's most recent lab result is older than the maximum 21 days allowed.).  Past Medical History:  Diagnosis Date  . H/O cardiovascular stress test 2007   EF 62%, decrease anterioseptal egion uptake on rest images only  . Hypertension   . Medically noncompliant   . Obesity   . OSA (obstructive sleep apnea)    Could not tolerate CPAP now on oral device followed by Dr Toy Cookey  . Peripheral neuropathy 09/2011  . Spastic colon   . Xiphoid prominence 09/2011    Current Outpatient Prescriptions on File Prior to Visit  Medication Sig Dispense Refill  . carvedilol (COREG) 25 MG tablet Take 1 tablet (25 mg total) by mouth 2 (two) times  daily. 60 tablet 11  . metoprolol succinate (TOPROL-XL) 50 MG 24 hr tablet TAKE 1 TABLET BY MOUTH DAILY. TAKE WITH OR IMMEDIATELY FOLLOWING A MEAL 90 tablet 2  . rosuvastatin (CRESTOR) 20 MG tablet Take 1 tablet (20 mg total) by mouth daily. 90 tablet 3  . valsartan-hydrochlorothiazide (DIOVAN-HCT) 320-25 MG tablet TAKE 1 TABLET BY MOUTH DAILY. 90 tablet 2   No current facility-administered medications on file prior to visit.     Allergies  Allergen Reactions  . Norvasc [Amlodipine Besylate]     Headaches     Assessment/Plan:  1. Hypertension - BP now at goal < 130/7mmHg although pt did not tolerate higher dose of carvedilol and decreased his dose  back to 12.5mg  BID 3 weeks ago. Will continue carvedilol 12.5mg  BID and valsartan-HCTZ 320-25mg  daily. Pt will continue to check his BP at home and call clinic if he notices consistent elevated readings > 150/88mmHg. F/u in HTN clinic as needed.   Majestic Brister E. Dillyn Joaquin, PharmD, CPP, [place] [address] [place], [place] [address] Phone: [phone]; Fax: [phone] 08/29/2016 8:27 AM

## 2016-08-29 NOTE — Patient Instructions (Signed)
It was nice to see you today  Your blood pressure looked excellent today  Continue taking your medications - I sent I a refill for the lower carvedilol 12.5mg  twice daily   Call Megan in the blood pressure clinic if your readings become consistently elevated above 150/90. [phone]

## 2016-11-24 NOTE — Telephone Encounter (Signed)
Called  LMTCB concerning if patient ever received his new chip so we could get a download.

## 2017-02-03 DIAGNOSIS — Z23 Encounter for immunization: Secondary | ICD-10-CM | POA: Diagnosis not present

## 2017-03-30 ENCOUNTER — Encounter: Payer: Self-pay | Admitting: Cardiology

## 2017-04-10 ENCOUNTER — Telehealth: Payer: Self-pay | Admitting: *Deleted

## 2017-04-10 ENCOUNTER — Ambulatory Visit: Payer: Self-pay | Admitting: Cardiology

## 2017-04-10 ENCOUNTER — Encounter: Payer: Self-pay | Admitting: Cardiology

## 2017-04-10 VITALS — BP 142/90 | HR 77 | Ht 71.0 in | Wt 274.0 lb

## 2017-04-10 DIAGNOSIS — G4733 Obstructive sleep apnea (adult) (pediatric): Secondary | ICD-10-CM

## 2017-04-10 DIAGNOSIS — R079 Chest pain, unspecified: Secondary | ICD-10-CM | POA: Insufficient documentation

## 2017-04-10 DIAGNOSIS — E669 Obesity, unspecified: Secondary | ICD-10-CM

## 2017-04-10 DIAGNOSIS — I10 Essential (primary) hypertension: Secondary | ICD-10-CM

## 2017-04-10 MED ORDER — VALSARTAN-HYDROCHLOROTHIAZIDE 320-25 MG PO TABS: 1.0000 | ORAL_TABLET | Freq: Every day | ORAL | 3 refills | Status: DC

## 2017-04-10 NOTE — Telephone Encounter (Signed)
Resmed Airfit P20 mask and chin strap Order sent to [facility] via community message

## 2017-04-10 NOTE — Patient Instructions (Signed)
Medication Instructions:  Your physician recommends that you continue on your current medications as directed. Please refer to the Current Medication list given to you today.  Labwork: None Ordered   Testing/Procedures: None Ordered   Follow-Up: Your physician wants you to follow-up in: 1 year with Dr. Laguna. You will receive a reminder letter in the mail two months in advance. If you don't receive a letter, please call our office to schedule the follow-up appointment.  Any Other Special Instructions Will Be Listed Below (If Applicable).  CPAP supplies have been ordered. You will receive a call from the home health agency regarding setting up equipment. If you do not receive a call within the next week give Nina, CPAP assistant a call at [phone].   Thank you for choosing [facility]    Rena Arpan Eskelson, RN  [phone]    If you need a refill on your cardiac medications before your next appointment, please call your pharmacy.   

## 2017-04-10 NOTE — Progress Notes (Signed)
Cardiology Office Note:    Date:  04/10/2017   ID:  Patrick Gilmore, DOB 12/03/1955, MRN [id number]  PCP:  [facility] [place]  Cardiologist:  No primary care provider on file.    Referring MD: [facility]   Chief Complaint  Patient presents with  . Sleep Apnea  . Hypertension    History of Present Illness:    Patrick Gilmore is a 62 y.o. male with a hx of HTN, obesity, dyslipidemia and OSA on CPAP.  He is doing well with his CPAP device.  He recently was changed from a nasal pillow mask which he is doing well with but the mask is old and the strap is stretched out.  He feels the pressure is adequate.  Since going on CPAP he feels rested in the am if he has used the device and has no significant daytime sleepiness.  He has a fair mouth dryness and uses Biotene mouth wash.  He does not use a chin strap. He does not think that he snores.    He has been seeing Dr. Damita Dunnings at [facility] in [place] who found a 4.5cm thoracic aortic aneurysm which he is following he is supposed to have a repeat CT scan and 2D echo done.  He says sometimes he will get CP at night when he is not wearing the mask.  He does not get any CP during the day with activity.    Past Medical History:  Diagnosis Date  . H/O cardiovascular stress test 2007   EF 62%, decrease anterioseptal egion uptake on rest images only  . Hypertension   . Medically noncompliant   . Obesity   . OSA (obstructive sleep apnea)    Could not tolerate CPAP now on oral device followed by Dr Toy Cookey  . Peripheral neuropathy 09/2011  . Spastic colon   . 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 COLONOSCOPY     normal 10/10 return 10/20  . SHOULDER SURGERY  1978    Current Medications: Current Meds  Medication Sig  . rosuvastatin (CRESTOR) 20 MG tablet Take 1 tablet (20 mg total) by mouth daily.  . valsartan-hydrochlorothiazide (DIOVAN-HCT) 320-25 MG tablet TAKE 1  TABLET BY MOUTH DAILY.     Allergies:   Amlodipine and Norvasc [amlodipine besylate]   Social History   Socioeconomic History  . Marital status: Married    Spouse name: leslie  . Number of children: 2  . Years of education: college  . Highest education level: None  Social Needs  . Financial resource strain: None  . Food insecurity - worry: None  . Food insecurity - inability: None  . Transportation needs - medical: None  . Transportation needs - non-medical: None  Occupational History  . Occupation: self employed  Tobacco Use  . Smoking status: Never Smoker  . Smokeless tobacco: Never Used  Substance and Sexual Activity  . Alcohol use: Yes    Comment: occassional wine  . Drug use: No  . Sexual activity: None  Other Topics Concern  . None  Social History Narrative  . None     Family History: The patient's family history includes Breast cancer in his mother and sister; Colon polyps in his brother and mother; Diabetes in his brother; Hypertension in his brother; Kidney failure in his father. There is no history of Colon cancer, Stomach cancer, Rectal cancer, Esophageal cancer, or Liver cancer.  ROS:  Please see the history of present illness.    ROS  All other systems reviewed and negative.   EKGs/Labs/Other Studies Reviewed:    The following studies were reviewed today: CPAP download  EKG:  EKG is not ordered today  Recent Labs: 06/22/2016: ALT 45; BUN 17; Creatinine, Ser 1.05; Potassium 4.3; Sodium 139   Recent Lipid Panel    Component Value Date/Time   CHOL 140 06/22/2016 0909   TRIG 170 (H) 06/22/2016 0909   HDL 44 06/22/2016 0909   CHOLHDL 3.2 06/22/2016 0909   CHOLHDL 3.6 05/13/2015 0844   VLDL 37 (H) 05/13/2015 0844   LDLCALC 62 06/22/2016 0909    Physical Exam:    VS:  BP (!) 142/90   Pulse 77   Ht 5\' 11"  (1.803 m)   Wt 274 lb (124.3 kg)   SpO2 97%   BMI 38.22 kg/m     Wt Readings from Last 3 Encounters:  04/10/17 274 lb (124.3 kg)    06/22/16 272 lb (123.4 kg)  05/25/16 275 lb 6 oz (124.9 kg)     GEN:  Well nourished, well developed in no acute distress HEENT: Normal NECK: No JVD; No carotid bruits LYMPHATICS: No lymphadenopathy CARDIAC: RRR, no murmurs, rubs, gallops RESPIRATORY:  Clear to auscultation without rales, wheezing or rhonchi  ABDOMEN: Soft, non-tender, non-distended MUSCULOSKELETAL:  No edema; No deformity  SKIN: Warm and dry NEUROLOGIC:  Alert and oriented x 3 PSYCHIATRIC:  Normal affect   ASSESSMENT:    1. OSA (obstructive sleep apnea)   2. Benign essential HTN   3. Obesity (BMI 30-39.9)   4. Chest pain, unspecified type    PLAN:    In order of problems listed above:  1.  OSA - the patient is tolerating PAP therapy well without any problems.  The patient has been using and benefiting from PAP use and will continue to benefit from therapy.  I will get a download from the DME  2.  HTN - BP is borderline controlled on exam today.  He will continue on Diovan HCT 320-25mg  daily and carvedilol 12.5mg  BID.   3.  Obesity - I have encouraged him to get into a routine exercise program and cut back on carbs and portions.   4.  Chest pain - atypical and he thinks it is related not use CPAP sometimes.  He is overweight and given that this only occurs at night it may be related to GERD.  Given his history of aortic aneurysm he has vascular disease and has CRFs.  I have encouraged him to contact his Cardiologist in [place] to let him know.     Medication Adjustments/Labs and Tests Ordered: Current medicines are reviewed at length with the patient today.  Concerns regarding medicines are outlined above.  No orders of the defined types were placed in this encounter.  No orders of the defined types were placed in this encounter.   Signed, Fransico Him, MD  04/10/2017 10:53 AM    [place]

## 2017-05-02 DIAGNOSIS — J069 Acute upper respiratory infection, unspecified: Secondary | ICD-10-CM | POA: Diagnosis not present

## 2017-06-25 ENCOUNTER — Other Ambulatory Visit: Payer: Self-pay | Admitting: Cardiology

## 2017-06-27 NOTE — Telephone Encounter (Signed)
Please advise on refill request as patient was seen on 04/10/17 but last lipid panel is from one yr ago. Thanks, MI

## 2017-09-02 ENCOUNTER — Other Ambulatory Visit: Payer: Self-pay | Admitting: Cardiology

## 2018-01-02 DIAGNOSIS — J0100 Acute maxillary sinusitis, unspecified: Secondary | ICD-10-CM | POA: Diagnosis not present

## 2018-04-18 ENCOUNTER — Other Ambulatory Visit: Payer: Self-pay | Admitting: Cardiology

## 2018-05-18 ENCOUNTER — Other Ambulatory Visit: Payer: Self-pay | Admitting: *Deleted

## 2018-05-18 ENCOUNTER — Telehealth: Payer: Self-pay | Admitting: Cardiology

## 2018-05-18 MED ORDER — VALSARTAN-HYDROCHLOROTHIAZIDE 320-25 MG PO TABS: 1.0000 | ORAL_TABLET | Freq: Every day | ORAL | 0 refills | Status: DC

## 2018-05-18 MED ORDER — ROSUVASTATIN CALCIUM 20 MG PO TABS: 20.0000 mg | ORAL_TABLET | Freq: Every day | ORAL | 0 refills | Status: DC

## 2018-05-18 MED ORDER — CARVEDILOL 12.5 MG PO TABS: 12.5000 mg | ORAL_TABLET | Freq: Two times a day (BID) | ORAL | 0 refills | Status: DC

## 2018-05-18 NOTE — Telephone Encounter (Signed)
New Message    *STAT* If patient is at the pharmacy, call can be transferred to refill team.   1. Which medications need to be refilled? (please list name of each medication and dose if known) valsartan-hydrochlorothiazide 320-25mg , carvedilol 12.5mg , and rosuvastatin 20mg   2. Which pharmacy/location (including street and city if local pharmacy) is medication to be sent to? [facility] in [place]  3. Do they need a 30 day or 90 day supply? 90 day supply

## 2018-06-25 ENCOUNTER — Telehealth: Payer: Self-pay | Admitting: Cardiology

## 2018-06-25 MED ORDER — CARVEDILOL 25 MG PO TABS: 25.0000 mg | ORAL_TABLET | Freq: Two times a day (BID) | ORAL | 3 refills | Status: DC

## 2018-06-25 NOTE — Telephone Encounter (Signed)
Recheck VS including HR

## 2018-06-25 NOTE — Telephone Encounter (Signed)
Pt c/o BP issue: STAT if pt c/o blurred vision, one-sided weakness or slurred speech  1. What are your last 5 BP readings?  187/127 190/130   2. Are you having any other symptoms (ex. Dizziness, headache, blurred vision, passed out)?   Dizziness, lightheaded  3. What is your BP issue? bp is higher than normal. Pt is concerned. Took an extra dose of medicine  (carvedolol) to bring it down.  Pt also said his vision was blurry, then he put on his reading glasses and his vision was fine.

## 2018-06-25 NOTE — Telephone Encounter (Signed)
Spoke with the patient, his BP 155/109 and HR 88.

## 2018-06-25 NOTE — Telephone Encounter (Signed)
Increase carvedilol to 25mg  BID and check BP twice daily for a week and call with results.  IF BP does not drop below 150/3mmHg tomorrow then call back

## 2018-06-25 NOTE — Telephone Encounter (Signed)
Spoke with the patient, he expressed understanding about taking Carvedilol 25 mg, twice a day. He also will call the office with the blood pressure values in 1 week.

## 2018-06-26 ENCOUNTER — Telehealth: Payer: Self-pay | Admitting: Cardiology

## 2018-06-26 DIAGNOSIS — I10 Essential (primary) hypertension: Secondary | ICD-10-CM

## 2018-06-26 MED ORDER — SPIRONOLACTONE 25 MG PO TABS: 25.0000 mg | ORAL_TABLET | Freq: Every day | ORAL | 3 refills | Status: DC

## 2018-06-26 NOTE — Telephone Encounter (Signed)
Spoke with the patient, he expressed understanding about our changes. BMET scheduled on 07/04/18.

## 2018-06-26 NOTE — Telephone Encounter (Signed)
Add spironolactone 25mg  daily and check BMET in 1 week.  Have patient check BP daily for a week and call with results.

## 2018-06-26 NOTE — Telephone Encounter (Signed)
New Message    Pt is calling and leaving his BP readings  Yesterday  330pm 160/110  730pm 146/94 9pm 143/92   830AM 170/107 1023AM 158/105 1054AM 141/90    Pt took meds at 9am this morning and 9pm last night

## 2018-07-04 ENCOUNTER — Other Ambulatory Visit: Payer: Self-pay

## 2018-07-04 ENCOUNTER — Other Ambulatory Visit: Payer: 59 | Admitting: *Deleted

## 2018-07-04 DIAGNOSIS — I10 Essential (primary) hypertension: Secondary | ICD-10-CM

## 2018-07-04 LAB — BASIC METABOLIC PANEL
BUN/Creatinine Ratio: 22 (ref 10–24)
BUN: 30 mg/dL — ABNORMAL HIGH (ref 8–27)
CO2: 20 mmol/L (ref 20–29)
Calcium: 10.5 mg/dL — ABNORMAL HIGH (ref 8.6–10.2)
Chloride: 95 mmol/L — ABNORMAL LOW (ref 96–106)
Creatinine, Ser: 1.37 mg/dL — ABNORMAL HIGH (ref 0.76–1.27)
GFR calc Af Amer: 64 mL/min/{1.73_m2} (ref >59)
GFR calc non Af Amer: 55 mL/min/{1.73_m2} — ABNORMAL LOW (ref >59)
Glucose: 156 mg/dL — ABNORMAL HIGH (ref 65–99)
Potassium: 5.1 mmol/L (ref 3.5–5.2)
Sodium: 134 mmol/L (ref 134–144)

## 2018-07-05 ENCOUNTER — Other Ambulatory Visit: Payer: Self-pay

## 2018-07-05 DIAGNOSIS — I10 Essential (primary) hypertension: Secondary | ICD-10-CM

## 2018-07-05 MED ORDER — DOXAZOSIN MESYLATE 2 MG PO TABS: 2.0000 mg | ORAL_TABLET | Freq: Every day | ORAL | 3 refills | Status: DC

## 2018-07-05 NOTE — Telephone Encounter (Signed)
-----   Message from Sueanne Margarita, MD sent at 07/04/2018  9:11 PM EDT ----- Creatinine has increased after adding spironolactone.  Please stop spironolactone and add doxazosin 2mg  qhs and check BP daily for a week and call with the results.  Please encourage patient to follow < 2gm sodium diet and no added salt.  Repeat BMET in 1 week

## 2018-07-05 NOTE — Telephone Encounter (Signed)
Left message to call back  

## 2018-07-05 NOTE — Telephone Encounter (Signed)
Follow up: ° ° ° °Patient returning your call back.  °

## 2018-07-05 NOTE — Telephone Encounter (Signed)
Spoke with the patient, he accepted taking the medication. He stated that he reduced the coreg where he was only taking 25 mg in the morning and 12.5 mg in the evening. He said if he took it twice a day, his pressure was around 95/65 and he felt tired. The way he has been taking it, his pressure has been around 130s over 85. He also requested when he goes in for labs if he could get his other yearly labs. Does he need a Lipid and Liver with the BMET on 5/15?

## 2018-07-09 ENCOUNTER — Telehealth: Payer: Self-pay | Admitting: Cardiology

## 2018-07-09 NOTE — Telephone Encounter (Signed)
Virtual Visit Pre-Appointment Phone Call  "(Name), I am calling you today to discuss your upcoming appointment. We are currently trying to limit exposure to the virus that causes COVID-19 by seeing patients at home rather than in the office."  1. "What is the BEST phone number to call the day of the visit?" - include this in appointment notes  2. Do you have or have access to (through a family member/friend) a smartphone with video capability that we can use for your visit?" a. If yes - list this number in appt notes as cell (if different from BEST phone #) and list the appointment type as a VIDEO visit in appointment notes b. If no - list the appointment type as a PHONE visit in appointment notes  3. Confirm consent - "In the setting of the current Covid19 crisis, you are scheduled for a (phone or video) visit with your provider on (date) at (time).  Just as we do with many in-office visits, in order for you to participate in this visit, we must obtain consent.  If you'd like, I can send this to your mychart (if signed up) or email for you to review.  Otherwise, I can obtain your verbal consent now.  All virtual visits are billed to your insurance company just like a normal visit would be.  By agreeing to a virtual visit, we'd like you to understand that the technology does not allow for your provider to perform an examination, and thus may limit your provider's ability to fully assess your condition. If your provider identifies any concerns that need to be evaluated in person, we will make arrangements to do so.  Finally, though the technology is pretty good, we cannot assure that it will always work on either your or our end, and in the setting of a video visit, we may have to convert it to a phone-only visit.  In either situation, we cannot ensure that we have a secure connection.  Are you willing to proceed?" STAFF: Did the patient verbally acknowledge consent to telehealth visit? Document  YES/NO here: Yes  4. Advise patient to be prepared - "Two hours prior to your appointment, go ahead and check your blood pressure, pulse, oxygen saturation, and your weight (if you have the equipment to check those) and write them all down. When your visit starts, your provider will ask you for this information. If you have an Apple Watch or Kardia device, please plan to have heart rate information ready on the day of your appointment. Please have a pen and paper handy nearby the day of the visit as well."  5. Give patient instructions for MyChart download to smartphone OR Doximity/Doxy.me as below if video visit (depending on what platform provider is using)  6. Inform patient they will receive a phone call 15 minutes prior to their appointment time (may be from unknown caller ID) so they should be prepared to answer    TELEPHONE CALL NOTE  Patrick Gilmore has been deemed a candidate for a follow-up tele-health visit to limit community exposure during the Covid-19 pandemic. I spoke with the patient via phone to ensure availability of phone/video source, confirm preferred email & phone number, and discuss instructions and expectations.  I reminded Patrick Gilmore to be prepared with any vital sign and/or heart rhythm information that could potentially be obtained via home monitoring, at the time of his visit. I reminded Patrick Gilmore to expect a phone call prior to  his visit.  Armando Gang 07/09/2018 11:23 AM   INSTRUCTIONS FOR DOWNLOADING THE MYCHART APP TO SMARTPHONE  - The patient must first make sure to have activated MyChart and know their login information - If Apple, go to [employer] and type in MyChart in the search bar and download the app. If Android, ask patient to go to [employer] and type in [place] in the search bar and download the app. The app is free but as with any other app downloads, their phone may require them to verify saved payment information or Apple/Android  password.  - The patient will need to then log into the app with their MyChart username and password, and select [facility] as their healthcare provider to link the account. When it is time for your visit, go to the MyChart app, find appointments, and click Begin Video Visit. Be sure to Select Allow for your device to access the Microphone and Camera for your visit. You will then be connected, and your provider will be with you shortly.  **If they have any issues connecting, or need assistance please contact MyChart service desk [phone])**  **If using a computer, in order to ensure the best quality for their visit they will need to use either of the following Internet Browsers: [employer], or Google Chrome**  IF USING DOXIMITY or DOXY.ME - The patient will receive a link just prior to their visit by text.     FULL LENGTH CONSENT FOR TELE-HEALTH VISIT   I hereby voluntarily request, consent and authorize [facility] and its employed or contracted physicians, physician assistants, nurse practitioners or other licensed health care professionals (the Practitioner), to provide me with telemedicine health care services (the Services") as deemed necessary by the treating Practitioner. I acknowledge and consent to receive the Services by the Practitioner via telemedicine. I understand that the telemedicine visit will involve communicating with the Practitioner through live audiovisual communication technology and the disclosure of certain medical information by electronic transmission. I acknowledge that I have been given the opportunity to request an in-person assessment or other available alternative prior to the telemedicine visit and am voluntarily participating in the telemedicine visit.  I understand that I have the right to withhold or withdraw my consent to the use of telemedicine in the course of my care at any time, without affecting my right to future care or treatment,  and that the Practitioner or I may terminate the telemedicine visit at any time. I understand that I have the right to inspect all information obtained and/or recorded in the course of the telemedicine visit and may receive copies of available information for a reasonable fee.  I understand that some of the potential risks of receiving the Services via telemedicine include:   Delay or interruption in medical evaluation due to technological equipment failure or disruption;  Information transmitted may not be sufficient (e.g. poor resolution of images) to allow for appropriate medical decision making by the Practitioner; and/or   In rare instances, security protocols could fail, causing a breach of personal health information.  Furthermore, I acknowledge that it is my responsibility to provide information about my medical history, conditions and care that is complete and accurate to the best of my ability. I acknowledge that Practitioner's advice, recommendations, and/or decision may be based on factors not within their control, such as incomplete or inaccurate data provided by me or distortions of diagnostic images or specimens that may result from electronic transmissions. I  understand that the practice of medicine is not an exact science and that Practitioner makes no warranties or guarantees regarding treatment outcomes. I acknowledge that I will receive a copy of this consent concurrently upon execution via email to the email address I last provided but may also request a printed copy by calling the office of [facility].    I understand that my insurance will be billed for this visit.   I have read or had this consent read to me.  I understand the contents of this consent, which adequately explains the benefits and risks of the Services being provided via telemedicine.   I have been provided ample opportunity to ask questions regarding this consent and the Services and have had my questions  answered to my satisfaction.  I give my informed consent for the services to be provided through the use of telemedicine in my medical care  By participating in this telemedicine visit I agree to the above.

## 2018-07-09 NOTE — Telephone Encounter (Signed)
unable to reach to reschedule and get consent 07/09/18

## 2018-07-10 ENCOUNTER — Telehealth (INDEPENDENT_AMBULATORY_CARE_PROVIDER_SITE_OTHER): Payer: 59 | Admitting: Cardiology

## 2018-07-10 ENCOUNTER — Other Ambulatory Visit: Payer: Self-pay

## 2018-07-10 ENCOUNTER — Encounter: Payer: Self-pay | Admitting: Cardiology

## 2018-07-10 ENCOUNTER — Telehealth: Payer: Self-pay | Admitting: *Deleted

## 2018-07-10 VITALS — BP 120/76 | HR 69 | Ht 71.0 in | Wt 270.0 lb

## 2018-07-10 DIAGNOSIS — I10 Essential (primary) hypertension: Secondary | ICD-10-CM | POA: Diagnosis not present

## 2018-07-10 DIAGNOSIS — G4733 Obstructive sleep apnea (adult) (pediatric): Secondary | ICD-10-CM

## 2018-07-10 DIAGNOSIS — E785 Hyperlipidemia, unspecified: Secondary | ICD-10-CM

## 2018-07-10 MED ORDER — CARVEDILOL 12.5 MG PO TABS: 12.5000 mg | ORAL_TABLET | Freq: Two times a day (BID) | ORAL | 3 refills | Status: DC

## 2018-07-10 NOTE — Telephone Encounter (Signed)
-----   Message from Sueanne Margarita, MD sent at 07/10/2018  8:33 AM EDT ----- Please order new CPAP supplies.  Call in her care and have them set up an appointment in person with Patrick Gilmore to discuss problems with his mask and get a new cushion.  I need a download from the DME.

## 2018-07-10 NOTE — Progress Notes (Signed)
Virtual Visit via Video Note   This visit type was conducted due to national recommendations for restrictions regarding the COVID-19 Pandemic (e.g. social distancing) in an effort to limit this patient's exposure and mitigate transmission in our community.  Due to his co-morbid illnesses, this patient is at least at moderate risk for complications without adequate follow up.  This format is felt to be most appropriate for this patient at this time.  All issues noted in this document were discussed and addressed.  A limited physical exam was performed with this format.  Please refer to the patient's chart for his consent to telehealth for [facility].  Evaluation Performed:  Follow-up visit  This visit type was conducted due to national recommendations for restrictions regarding the COVID-19 Pandemic (e.g. social distancing).  This format is felt to be most appropriate for this patient at this time.  All issues noted in this document were discussed and addressed.  No physical exam was performed (except for noted visual exam findings with Video Visits).  Please refer to the patient's chart (MyChart message for video visits and phone note for telephone visits) for the patient's consent to telehealth for [facility].  Date:  07/10/2018   ID:  Patrick Gilmore, DOB 10/15/1955, MRN [id number]  Patient Location:  Home  Provider location:   [facility]  PCP:  Patient, No Pcp Per  Cardiologist:  Fransico Him, MD Electrophysiologist:  None   Chief Complaint:  OSA, HTN  History of Present Illness:    Patrick Gilmore is a 63 y.o. male who presents via audio/video conferencing for a telehealth visit today.    Patrick Gilmore is a 63 y.o. male with a hx of HTN, obesity, dyslipidemia and OSA on CPAP.  He is doing well with his CPAP device and thinks that he has gotten used to it.  He tolerates the mask and feels the pressure is adequate.  Since going on CPAP he feels rested in the am and has no  significant daytime sleepiness.  He denies any significant mouth or nasal dryness or nasal congestion.  He does not think that he snores.     The patient does not have symptoms concerning for COVID-19 infection (fever, chills, cough, or new shortness of breath).    Prior CV studies:   The following studies were reviewed today:  none  Past Medical History:  Diagnosis Date  . H/O cardiovascular stress test 2007   EF 62%, decrease anterioseptal egion uptake on rest images only  . Hypertension   . Medically noncompliant   . Obesity   . OSA (obstructive sleep apnea)    Could not tolerate CPAP now on oral device followed by Dr Toy Cookey  . Peripheral neuropathy 09/2011  . Spastic colon   . 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 COLONOSCOPY     normal 10/10 return 10/20  . SHOULDER SURGERY  1978     Current Meds  Medication Sig  . carvedilol (COREG) 25 MG tablet Take 25 mg by mouth daily.  Marland Kitchen doxazosin (CARDURA) 2 MG tablet Take 1 tablet (2 mg total) by mouth at bedtime.  . rosuvastatin (CRESTOR) 20 MG tablet Take 1 tablet (20 mg total) by mouth daily.  . valsartan-hydrochlorothiazide (DIOVAN-HCT) 320-25 MG tablet Take 1 tablet by mouth daily.     Allergies:   Amlodipine and Norvasc [amlodipine besylate]   Social History   Tobacco Use  . Smoking status: Never  Smoker  . Smokeless tobacco: Never Used  Substance Use Topics  . Alcohol use: Yes    Comment: occassional wine  . Drug use: No     Family Hx: The patient's family history includes Breast cancer in his mother and sister; Colon polyps in his brother and mother; Diabetes in his brother; Hypertension in his brother; Kidney failure in his father. There is no history of Colon cancer, Stomach cancer, Rectal cancer, Esophageal cancer, or Liver cancer.  ROS:   Please see the history of present illness.     All other systems reviewed and are negative.   Labs/Other Tests and Data  Reviewed:    Recent Labs: 07/04/2018: BUN 30; Creatinine, Ser 1.37; Potassium 5.1; Sodium 134   Recent Lipid Panel Lab Results  Component Value Date/Time   CHOL 140 06/22/2016 09:09 AM   TRIG 170 (H) 06/22/2016 09:09 AM   HDL 44 06/22/2016 09:09 AM   CHOLHDL 3.2 06/22/2016 09:09 AM   CHOLHDL 3.6 05/13/2015 08:44 AM   LDLCALC 62 06/22/2016 09:09 AM    Wt Readings from Last 3 Encounters:  07/10/18 270 lb (122.5 kg)  04/10/17 274 lb (124.3 kg)  06/22/16 272 lb (123.4 kg)     Objective:    Vital Signs:  BP 120/76 Comment: taken at 8:08am  Pulse 69   Ht 5\' 11"  (1.803 m)   Wt 270 lb (122.5 kg)   BMI 37.66 kg/m    CONSTITUTIONAL:  Well nourished, well developed male in no acute distress.  EYES: anicteric MOUTH: oral mucosa is pink RESPIRATORY: Normal respiratory effort, symmetric expansion CARDIOVASCULAR: No peripheral edema SKIN: No rash, lesions or ulcers MUSCULOSKELETAL: no digital cyanosis NEURO: Cranial Nerves II-XII grossly intact, moves all extremities PSYCH: Intact judgement and insight.  A&O x 3, Mood/affect appropriate   ASSESSMENT & PLAN:    1.  OSA - the patient is tolerating PAP therapy well without any problems. I will get a download from his DME.  The patient has been using and benefiting from PAP use and will continue to benefit from therapy.   2.  Hypertension - his BP is controlled today.  He will continue on  Doxazosin 2mg  daily and Valsartan-HCT 320-25mg  daily.  He was having problems with dizziness on the higher dose of carvedilol 25 mg.  He is now only taking 25 mg daily.  I change this to carvedilol 12.5 mg twice daily.  His last creatinine was 1.37 last week.    3.  Obesity - I have encouraged him to get into a routine exercise program and cut back on carbs and portions.   4.  Hyperlipidemia - he has not had lipids checked in some time. I will set him up for an FLP and ALT in July once the COVID crisis has improved.  He will continue on crestor 20mg   daily.    5.  COVID-19 Education:The signs and symptoms of COVID-19 were discussed with the patient and how to seek care for testing (follow up with PCP or arrange E-visit).  The importance of social distancing was discussed today.  Patient Risk:   After full review of this patient's clinical status, I feel that they are at least moderate risk at this time.  Time:   Today, I have spent 15 minutes directly with the patient on Video discussing medical problems including OSA, HTN and obesity and healthy diet and exercise.  We also reviewed the symptoms of COVID 19 and the ways to protect against contracting the  virus with telehealth technology.  I spent an additional 5 minutes reviewing patient's chart including labs.  Medication Adjustments/Labs and Tests Ordered: Current medicines are reviewed at length with the patient today.  Concerns regarding medicines are outlined above.  Tests Ordered: No orders of the defined types were placed in this encounter.  Medication Changes: No orders of the defined types were placed in this encounter.   Disposition:  Follow up in 1 year(s)  Signed, Fransico Him, MD  07/10/2018 8:24 AM    [facility]

## 2018-07-10 NOTE — Telephone Encounter (Signed)
Patient will bring his chip into the office for a download today.  Order placed today to [facility]

## 2018-07-10 NOTE — Patient Instructions (Signed)
Medication Instructions:  Your physician has recommended you make the following change in your medication:  1- Decrease Carvedilol 12.5 mg by mouth twice daily.  If you need a refill on your cardiac medications before your next appointment, please call your pharmacy.   Lab work: Your physician recommends that you return for lab work in: July for fasting lipid panel and ALT. Please make sure you have been fasting for at least 8 hours for this lab work.  If you have labs (blood work) drawn today and your tests are completely normal, you will receive your results only by: Marland Kitchen MyChart Message (if you have MyChart) OR . A paper copy in the mail If you have any lab test that is abnormal or we need to change your treatment, we will call you to review the results.  Testing/Procedures: None ordered today.  Follow-Up: At [facility], you and your health needs are our priority.  As part of our continuing mission to provide you with exceptional heart care, we have created designated Provider Care Teams.  These Care Teams include your primary Cardiologist (physician) and Advanced Practice Providers (APPs -  Physician Assistants and Nurse Practitioners) who all work together to provide you with the care you need, when you need it. You will need a follow up appointment in 1 years.  Please call our office 2 months in advance to schedule this appointment.  You may see Dr. Radford Pax or one of the following Advanced Practice Providers on your designated Care Team:   [place], PA-C Melina Copa, PA-C . Ermalinda Barrios, PA-C

## 2018-07-11 NOTE — Telephone Encounter (Signed)
Received chip and ran download today and will send to scan.

## 2018-07-13 ENCOUNTER — Telehealth: Payer: Self-pay | Admitting: *Deleted

## 2018-07-13 NOTE — Telephone Encounter (Signed)
-----   Message from Sueanne Margarita, MD sent at 07/13/2018  8:01 AM EDT ----- Good AHI on PAP but needs to improve compliance

## 2018-07-13 NOTE — Telephone Encounter (Signed)
Informed patient of compliance results and patient understanding was verbalized. Patient is aware and agreeable to AHI being within range at 2.5. Patient is aware and agreeable to improving in compliance with machine usage

## 2018-07-26 ENCOUNTER — Ambulatory Visit: Payer: Self-pay | Admitting: Cardiology

## 2018-08-24 ENCOUNTER — Telehealth: Payer: Self-pay | Admitting: Cardiology

## 2018-08-24 NOTE — Telephone Encounter (Signed)
Called pt and left message informing pt that we got a refill request for Carvedilol 12.5 mg tablet, pt taken 1 1/2 tablet BID. I informed pt per LOV with Dr. Radford Pax, that Dr. Radford Pax decreased Carvedilol 25 mg tablet, to pt taking Carvedilol 12.5 mg tablet BID and if pt has any other problems, questions or concerns to call the office.

## 2018-09-12 ENCOUNTER — Other Ambulatory Visit: Payer: Self-pay

## 2018-09-12 ENCOUNTER — Other Ambulatory Visit: Payer: 59 | Admitting: *Deleted

## 2018-09-12 DIAGNOSIS — I10 Essential (primary) hypertension: Secondary | ICD-10-CM

## 2018-09-12 DIAGNOSIS — E785 Hyperlipidemia, unspecified: Secondary | ICD-10-CM

## 2018-09-12 LAB — LIPID PANEL
Chol/HDL Ratio: 3.1 ratio (ref 0.0–5.0)
Cholesterol, Total: 140 mg/dL (ref 100–199)
HDL: 45 mg/dL (ref >39)
LDL Calculated: 61 mg/dL (ref 0–99)
Triglycerides: 172 mg/dL — ABNORMAL HIGH (ref 0–149)
VLDL Cholesterol Cal: 34 mg/dL (ref 5–40)

## 2018-09-12 LAB — ALT: ALT: 62 IU/L — ABNORMAL HIGH (ref 0–44)

## 2018-09-12 LAB — BASIC METABOLIC PANEL
BUN/Creatinine Ratio: 16 (ref 10–24)
BUN: 15 mg/dL (ref 8–27)
CO2: 24 mmol/L (ref 20–29)
Calcium: 9.4 mg/dL (ref 8.6–10.2)
Chloride: 101 mmol/L (ref 96–106)
Creatinine, Ser: 0.92 mg/dL (ref 0.76–1.27)
GFR calc Af Amer: 103 mL/min/{1.73_m2} (ref >59)
GFR calc non Af Amer: 89 mL/min/{1.73_m2} (ref >59)
Glucose: 117 mg/dL — ABNORMAL HIGH (ref 65–99)
Potassium: 4 mmol/L (ref 3.5–5.2)
Sodium: 140 mmol/L (ref 134–144)

## 2018-09-17 ENCOUNTER — Other Ambulatory Visit: Payer: Self-pay | Admitting: Cardiology

## 2018-09-19 ENCOUNTER — Other Ambulatory Visit: Payer: Self-pay

## 2018-09-19 DIAGNOSIS — E785 Hyperlipidemia, unspecified: Secondary | ICD-10-CM

## 2018-09-19 NOTE — Progress Notes (Signed)
Per Dr. Candie Mile to lipid clinic for elevated triglycerides

## 2018-09-28 ENCOUNTER — Other Ambulatory Visit: Payer: Self-pay | Admitting: Cardiology

## 2018-10-01 NOTE — Patient Instructions (Addendum)
It was great seeing you today in clinic Patrick Gilmore!  Your triglyceride levels are 172, which is higher than the goal of <150.   The plan today is ...  1. Continue taking rosuvastatin    2. Try to be more mindful of your diet (drinking less sweet tea/wine, eating less fast food)  3. Try to exercise for 10 minutes/day and eventually increase to 150 minutes/week.   4. Call [facility] and ask them to take spironolactone off of your medication profile.   5. You have follow up cholesterol labs scheduled for November 2nd 2020 at 8:00 AM. Please remember these are fasting labs so do not drink or eat anything 8-12 hours before. You can drink water though.   6. Call us at [phone] to speak with a clinical pharmacist Patrick Gilmore, Patrick Gilmore) with any questions or concerns.

## 2018-10-01 NOTE — Progress Notes (Signed)
Patient ID: [name]                 DOB: 11/19/1955                    MRN: [id number]     HPI: Patrick Gilmore is a 63 y.o. male patient referred to lipid clinic by Dr. Radford Pax due to elevated TGs. PMH is significant for HTN, obesity, dyslipidemia, and OSA on CPAP.  Pt presents today for initial HLD PharmD clinic appt. Pt is aware that his TG readings have been elevated for past few years. He states it is challenging to focus on diet/exercise d/t working 3 different jobs. However, he is motivated to lower TG levels by focusing on diet/exercise considering his level is close to goal. He is planning on going to the beach in 2 weeks and is looking forward to going on walks there.   Current Medications: rosuvastatin 20 mg daily Intolerances: none  Risk Factors: HTN, obesity (BMI 37.7), dyslipidemia LDL goal: < 100 mg/dL, TG goal: < 150  Diet:  Breakfast - 2 cups of coffee, biscuits, eggs, croissants Lunch - sandwich  Dinner - pasta, seafood Snacks - nuts  Drinks - sweet tea, crystal light lemonade  Sweets - candy bars occasionally Eats a lot of fast food    Exercise: none  Family History: mother (breast cancer, colon polyps), father (kidney failure), sister (breast cancer), brother (colon polyps, DM, HTN)  Social History: drinks 3 glasses of wine every night (chardonnay), never smoked. Wife is a good cook  Labs: 09/12/18: TC 140, TG 172, HDL 45, VLDL 34, LDL 61; rosuvastatin 20 mg daily  The 10-year ASCVD risk score Patrick Bussing DC Jr., et al., 2013) is: 7.7%   Values used to calculate the score:     Age: 89 years     Sex: Male     Is Non-Hispanic African American: No     Diabetic: No     Tobacco smoker: No     Systolic Blood Pressure: 924 mmHg     Is BP treated: Yes     HDL Cholesterol: 45 mg/dL     Total Cholesterol: 140 mg/dL   Past Medical History:  Diagnosis Date  . H/O cardiovascular stress test 2007   EF 62%, decrease anterioseptal egion uptake on rest images only  .  Hypertension   . Medically noncompliant   . Obesity   . OSA (obstructive sleep apnea)    Could not tolerate CPAP now on oral device followed by Dr Toy Cookey  . Peripheral neuropathy 09/2011  . Spastic colon   . 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 carvedilol (COREG) 12.5 MG tablet Take 1 tablet (12.5 mg total) by mouth 2 (two) times daily with a meal. 180 tablet 3  . doxazosin (CARDURA) 2 MG tablet Take 1 tablet (2 mg total) by mouth at bedtime. 90 tablet 3  . rosuvastatin (CRESTOR) 20 MG tablet TAKE 1 TABLET BY MOUTH EVERY DAY 90 tablet 3  . valsartan-hydrochlorothiazide (DIOVAN-HCT) 320-25 MG tablet TAKE 1 TABLET BY MOUTH EVERY DAY 90 tablet 2   No current facility-administered medications on file prior to visit.     Allergies  Allergen Reactions  . Amlodipine Other (See Comments)    Headaches  . Norvasc [Amlodipine Besylate]     Headaches    Assessment/Plan:  1. Hypertriglyceridemia - Pt's TG are mildly elevated  above goal of <150 mg/dL. Pt does not have ASCVD or T2DM. He only has one risk factor for CVD (obesity) and has an ASCVD risk score of 7.7% (intermediate risk). Until pt has two risk factors or is dx with ASCVD/T2DM it is not recommended for him to initiate icosapent ethyl (Vascepa) for ASCVD propylaxis, however, can consider this option in the future. Counseled pt thoroughly on healthy diet -- try half sweet tea/half unsweet, limiting how many days per week he eats fast food, cutting back on wine to 1 glass each evening, and eating less carbs; pt verbalized understanding. Also, recommended to pt he starts exercising - he is agreeable to walking at least 10 min/day. Provided pt with handout regarding healthy diet choices. Plan to f/u with pt on November 4th 2020 with results of updated lipid panel to determine if diet/exercise has had an impact on his TG levels.   2. Dyslipidemia - Pt's LDL is  currently at goal <100 mg/dL on statin therapy and is not experiencing any ADEs. Plan to continue rosuvastatin 20 mg daily.  3. Spironolactone pharmacy refill - Pt addressed concern of how to take spironolactone off his medication list at the pharmacy. Informed pt he will need to call [facility] and ask them to deactivate prescription so it does not automatically refill.    Thank you for allowing pharmacy to assist in providing Patrick Gilmore's care  Drexel Iha, PharmD PGY2 Ambulatory Care Pharmacy Resident

## 2018-10-02 ENCOUNTER — Ambulatory Visit (INDEPENDENT_AMBULATORY_CARE_PROVIDER_SITE_OTHER): Payer: 59 | Admitting: Pharmacist

## 2018-10-02 ENCOUNTER — Other Ambulatory Visit: Payer: Self-pay

## 2018-10-02 DIAGNOSIS — E785 Hyperlipidemia, unspecified: Secondary | ICD-10-CM | POA: Diagnosis not present

## 2018-10-08 MED ORDER — CARVEDILOL 25 MG PO TABS: 25.0000 mg | ORAL_TABLET | Freq: Two times a day (BID) | ORAL | 3 refills | Status: DC

## 2018-11-20 ENCOUNTER — Encounter: Payer: Self-pay | Admitting: Internal Medicine

## 2018-12-31 ENCOUNTER — Other Ambulatory Visit: Payer: 59

## 2019-01-02 ENCOUNTER — Telehealth: Payer: Self-pay | Admitting: Pharmacist

## 2019-01-02 NOTE — Telephone Encounter (Signed)
Called pt on 01/02/2019 at 12:25PM.  Patient was scheduled to get labs on 01/01/2019. However, it appears patient has not gotten labs. Called pt to clarify why he had not gotten labs.  Pt stated he has not gotten labs because he will be out of town. He has rescheduled labs for 01/15/2019.  Plan to call pt on 01/16/2019 to discuss lab results.

## 2019-01-15 ENCOUNTER — Other Ambulatory Visit: Payer: 59

## 2019-01-15 ENCOUNTER — Other Ambulatory Visit: Payer: Self-pay

## 2019-01-15 DIAGNOSIS — E785 Hyperlipidemia, unspecified: Secondary | ICD-10-CM

## 2019-01-15 LAB — LIPID PANEL
Chol/HDL Ratio: 3.1 ratio (ref 0.0–5.0)
Cholesterol, Total: 154 mg/dL (ref 100–199)
HDL: 49 mg/dL (ref >39)
LDL Chol Calc (NIH): 76 mg/dL (ref 0–99)
Triglycerides: 172 mg/dL — ABNORMAL HIGH (ref 0–149)
VLDL Cholesterol Cal: 29 mg/dL (ref 5–40)

## 2019-01-16 ENCOUNTER — Telehealth: Payer: Self-pay | Admitting: Pharmacist

## 2019-01-16 NOTE — Telephone Encounter (Signed)
Called pt on 01/16/2019 at 1:52PM to follow up regarding status of lipid panel.  Informed pt his lipid panel results, specifically his TG remain at a reading of 172 (same as TG reading from 08/2018). Pt states he has been unable to focus on diet/exercise due to COVID-19 pandemic. Encouraged pt to focus on diet/exercise as he is able and to stay safe. Pt states he is motivated to live a healthier lifestyle after finding out yesterday that he will be a grandfather. Congratulated pt on the exciting news! Plan for pt to schedule f/u appt for lipid lab draw and HLD clinic as needed once he is able to focus on diet/exercise. Pt verbalized understanding.

## 2019-01-18 ENCOUNTER — Telehealth: Payer: Self-pay | Admitting: Cardiology

## 2019-01-18 ENCOUNTER — Telehealth: Payer: Self-pay

## 2019-01-18 DIAGNOSIS — E785 Hyperlipidemia, unspecified: Secondary | ICD-10-CM

## 2019-01-18 MED ORDER — ROSUVASTATIN CALCIUM 40 MG PO TABS: 40.0000 mg | ORAL_TABLET | Freq: Every day | ORAL | 3 refills | Status: DC

## 2019-01-18 NOTE — Telephone Encounter (Signed)
Notes recorded by Frederik Schmidt, RN on 01/18/2019 at 9:54 AM EST  [id number] 11/20  ------

## 2019-01-18 NOTE — Telephone Encounter (Signed)
-----   Message from Sueanne Margarita, MD sent at 01/16/2019  6:44 PM EST ----- LDL < 70.  Increase crestor to 40mg  daily.  Repeat FLP and ALT in 6 weeks

## 2019-01-18 NOTE — Telephone Encounter (Signed)
Follow up:     Patient returning a call back. Please call patient. 

## 2019-01-18 NOTE — Telephone Encounter (Signed)
The patient has been notified of the result and verbalized understanding.  All questions (if any) were answered. Frederik Schmidt, RN 01/18/2019 11:05 AM    I increased Crestor to 40 mg Daily and labs scheduled 03/04/19

## 2019-03-04 ENCOUNTER — Other Ambulatory Visit: Payer: Self-pay

## 2019-03-04 ENCOUNTER — Other Ambulatory Visit: Payer: 59 | Admitting: *Deleted

## 2019-03-04 DIAGNOSIS — E785 Hyperlipidemia, unspecified: Secondary | ICD-10-CM

## 2019-03-05 ENCOUNTER — Telehealth: Payer: Self-pay

## 2019-03-05 ENCOUNTER — Encounter: Payer: Self-pay | Admitting: Cardiology

## 2019-03-05 DIAGNOSIS — K602 Anal fissure, unspecified: Secondary | ICD-10-CM

## 2019-03-05 DIAGNOSIS — E785 Hyperlipidemia, unspecified: Secondary | ICD-10-CM

## 2019-03-05 LAB — LIPID PANEL
Chol/HDL Ratio: 3 ratio (ref 0.0–5.0)
Cholesterol, Total: 158 mg/dL (ref 100–199)
HDL: 53 mg/dL (ref >39)
LDL Chol Calc (NIH): 69 mg/dL (ref 0–99)
Triglycerides: 223 mg/dL — ABNORMAL HIGH (ref 0–149)
VLDL Cholesterol Cal: 36 mg/dL (ref 5–40)

## 2019-03-05 LAB — ALT: ALT: 74 IU/L — ABNORMAL HIGH (ref 0–44)

## 2019-03-05 LAB — SPECIMEN STATUS REPORT

## 2019-03-05 NOTE — Telephone Encounter (Signed)
error 

## 2019-03-05 NOTE — Telephone Encounter (Signed)
Patient would like a call back to discuss liver issue. Lab appointment has been scheduled.

## 2019-03-05 NOTE — Telephone Encounter (Signed)
Spoke with patient regarding increased ALT. Provided him with recommendations to increase his activity, eat a healthy diet, and decrease his alcohol intake. Patient verbalized understanding.

## 2019-03-05 NOTE — Telephone Encounter (Signed)
Left message for patient to call back  

## 2019-04-03 ENCOUNTER — Other Ambulatory Visit: Payer: Self-pay | Admitting: Cardiology

## 2019-04-05 ENCOUNTER — Other Ambulatory Visit: Payer: Self-pay

## 2019-04-05 ENCOUNTER — Other Ambulatory Visit: Payer: 59 | Admitting: *Deleted

## 2019-04-05 DIAGNOSIS — E785 Hyperlipidemia, unspecified: Secondary | ICD-10-CM

## 2019-04-05 LAB — HEPATIC FUNCTION PANEL
ALT: 45 IU/L — ABNORMAL HIGH (ref 0–44)
AST: 24 IU/L (ref 0–40)
Albumin: 4.1 g/dL (ref 3.8–4.8)
Alkaline Phosphatase: 40 IU/L (ref 39–117)
Bilirubin Total: 0.5 mg/dL (ref 0.0–1.2)
Bilirubin, Direct: 0.16 mg/dL (ref 0.00–0.40)
Total Protein: 6.7 g/dL (ref 6.0–8.5)

## 2019-06-24 ENCOUNTER — Other Ambulatory Visit: Payer: Self-pay | Admitting: Cardiology

## 2019-07-17 ENCOUNTER — Other Ambulatory Visit: Payer: Self-pay | Admitting: Cardiology

## 2019-08-13 ENCOUNTER — Other Ambulatory Visit: Payer: Self-pay

## 2019-08-13 MED ORDER — DOXAZOSIN MESYLATE 2 MG PO TABS: 2.0000 mg | ORAL_TABLET | Freq: Every day | ORAL | 0 refills | Status: DC

## 2019-08-31 ENCOUNTER — Other Ambulatory Visit: Payer: Self-pay | Admitting: Cardiology

## 2019-09-05 ENCOUNTER — Ambulatory Visit: Payer: 59 | Admitting: Podiatry

## 2019-09-05 ENCOUNTER — Ambulatory Visit (INDEPENDENT_AMBULATORY_CARE_PROVIDER_SITE_OTHER): Payer: 59

## 2019-09-05 ENCOUNTER — Other Ambulatory Visit: Payer: Self-pay | Admitting: Podiatry

## 2019-09-05 ENCOUNTER — Other Ambulatory Visit: Payer: Self-pay

## 2019-09-05 ENCOUNTER — Encounter: Payer: Self-pay | Admitting: Podiatry

## 2019-09-05 VITALS — BP 143/79 | HR 70 | Temp 97.5°F | Resp 16

## 2019-09-05 DIAGNOSIS — M79671 Pain in right foot: Secondary | ICD-10-CM

## 2019-09-05 DIAGNOSIS — M79672 Pain in left foot: Secondary | ICD-10-CM

## 2019-09-05 DIAGNOSIS — M779 Enthesopathy, unspecified: Secondary | ICD-10-CM

## 2019-09-05 DIAGNOSIS — M7671 Peroneal tendinitis, right leg: Secondary | ICD-10-CM | POA: Diagnosis not present

## 2019-09-05 MED ORDER — PREDNISONE 10 MG PO TABS: ORAL_TABLET | ORAL | 0 refills | Status: DC

## 2019-09-05 NOTE — Progress Notes (Signed)
   Subjective:    Patient ID: Patrick Gilmore, male    DOB: [birth year], 64 y.o.   MRN: [id number]  HPI    Review of Systems  All other systems reviewed and are negative.      Objective:   Physical Exam        Assessment & Plan:

## 2019-09-06 ENCOUNTER — Encounter: Payer: Self-pay | Admitting: Podiatry

## 2019-09-06 ENCOUNTER — Other Ambulatory Visit: Payer: Self-pay | Admitting: Cardiology

## 2019-09-06 ENCOUNTER — Telehealth: Payer: Self-pay | Admitting: Podiatry

## 2019-09-06 NOTE — Progress Notes (Signed)
Subjective:   Patient ID: Patrick Gilmore, male   DOB: 64 y.o.   MRN: [id number]   HPI Patient presents stating that he has been getting pain in the bottom of both feet and he feels like at times his toes are numb.  States is been going on for over a year and that now he is developed pain also in the outside of his right foot.  States that he is on his feet a lot and that it does cause a lot of stress and patient does not smoke likes to be active   Review of Systems  All other systems reviewed and are negative.       Objective:  Physical Exam Vitals and nursing note reviewed.  Constitutional:      Appearance: He is well-developed.  Pulmonary:     Effort: Pulmonary effort is normal.  Musculoskeletal:        General: Normal range of motion.  Skin:    General: Skin is warm.  Neurological:     Mental Status: He is alert.     Neurovascular status intact muscle strength found to be adequate range of motion was adequate with no loss sharp dull vibratory.  Patient does have discomfort in the forefoot with the pain seemed to be centered around the second MPJ bilateral right slightly more than left and also has discomfort peroneal insertion base of fifth metatarsal right which may be compensatory.  There appears to be pain in the arch but I was unable to identify specific area but this pain     Assessment:  Possibility that were dealing with an inflammatory versus a neurological condition and at this point that is where were going to focus for him     Plan:  H&P reviewed condition and at this time I did a forefoot block I then aspirated the second MPJ getting out a clear fluid and injected with 1/4 cc dexamethasone Kenalog and then for the outside I went ahead did sterile prep and injected the peroneal insertion base of fifth metatarsal 3 mg Dexasone Kenalog 5 mg Xylocaine and instructed on Sterapred DS Dosepak which was written.  Reappoint 2 weeks to see results  X-rays indicate that there  does not appear to be a arthritic issue associated with this or stress fracture

## 2019-09-06 NOTE — Telephone Encounter (Signed)
Pt called stating he does not know how much medication he should take he did not get the medication info from pharmacy pt needs to know how many pills to take please assist

## 2019-09-06 NOTE — Telephone Encounter (Signed)
Left message informing pt the tapering dose prednisone should have instructions on the card, if not contact his pharmacy or me today before 5:00pm.

## 2019-09-19 ENCOUNTER — Ambulatory Visit: Payer: 59 | Admitting: Podiatry

## 2019-09-19 ENCOUNTER — Other Ambulatory Visit: Payer: Self-pay

## 2019-09-19 ENCOUNTER — Encounter: Payer: Self-pay | Admitting: Podiatry

## 2019-09-19 VITALS — Temp 97.2°F

## 2019-09-19 DIAGNOSIS — G629 Polyneuropathy, unspecified: Secondary | ICD-10-CM | POA: Diagnosis not present

## 2019-09-19 DIAGNOSIS — M779 Enthesopathy, unspecified: Secondary | ICD-10-CM

## 2019-09-19 DIAGNOSIS — M7671 Peroneal tendinitis, right leg: Secondary | ICD-10-CM | POA: Diagnosis not present

## 2019-09-19 MED ORDER — GABAPENTIN 300 MG PO CAPS: 300.0000 mg | ORAL_CAPSULE | Freq: Three times a day (TID) | ORAL | 3 refills | Status: DC

## 2019-09-23 ENCOUNTER — Other Ambulatory Visit: Payer: Self-pay

## 2019-09-23 MED ORDER — DOXAZOSIN MESYLATE 2 MG PO TABS: 2.0000 mg | ORAL_TABLET | Freq: Every day | ORAL | 0 refills | Status: DC

## 2019-09-23 MED ORDER — CARVEDILOL 25 MG PO TABS: 25.0000 mg | ORAL_TABLET | Freq: Two times a day (BID) | ORAL | 0 refills | Status: DC

## 2019-09-23 NOTE — Telephone Encounter (Signed)
Pt's medication carvedilol was sent to pt's pharmacy as requested. Confirmation received.

## 2019-09-23 NOTE — Progress Notes (Signed)
Subjective:   Patient ID: Patrick Gilmore, male   DOB: 64 y.o.   MRN: [id number]   HPI Patient presents stating that I am somewhat better but I still get the numbing-like sensations and it does not seem like I am getting as much pain as I had previously but I still have discomfort and I am an active person   ROS      Objective:  Physical Exam  Neurovascular status intact with the subsecond metatarsal pain right improved with diminished swelling around the joint surface and the pain in the outside of the fifth metatarsal right over left foot improved.  Patient is found to have no other pathology noted and while complaining of numbing-like discomforts I was unable to find organic cause     Assessment:  Difficult to say with him the differences between inflammatory capsulitis versus possible neuropathy-like symptomatology     Plan:  H&P discussed both conditions and at this point there is not a lot we can do for neuropathy but I have recommended orthotics to try to continue to reduce stress on his feet and the inflammatory component of his problem.  I would consider something a little more on the soft side but supportive and try to offload the second MPJ bilateral.  I am referring him to ped orthotist for orthotic casting

## 2019-09-23 NOTE — Addendum Note (Signed)
Addended by: Carter Kitten D on: 09/23/2019 12:29 PM   Modules accepted: Orders

## 2019-10-03 ENCOUNTER — Other Ambulatory Visit: Payer: 59 | Admitting: Orthotics

## 2019-10-21 ENCOUNTER — Other Ambulatory Visit: Payer: Self-pay | Admitting: Cardiology

## 2019-11-07 NOTE — Progress Notes (Addendum)
Date:  11/08/2019   ID:  Patrick Gilmore, DOB [birth year], MRN [id number]  PCP:  Patient, No Pcp Per  Cardiologist:  Fransico Him, MD Electrophysiologist:  None   Chief Complaint:  OSA, HTN  History of Present Illness:    Patrick Gilmore is a 64 y.o. male with a hx of HTN, obesity, dyslipidemia and OSA on CPAP. He has a hx of moderate ascending aortic aneurysm by CT in 2018.  This also showed some calcifications in the coronary arteries.  He has not had any chest pain although he says when he does not use his PAP device his chest wall  Pain but has not chest pressure or discomfort with exertion and no DOE.    He is doing well with his CPAP device and thinks that he has gotten used to it.  He tolerates the full face mask but has a lot of mouth dryness.  He has not tried to adjust the humidity on the device. He feels the pressure is adequate.  Since going on CPAP he feels rested in the am and has no significant daytime sleepiness.  He denies any nasal dryness or nasal congestion.  He does not think that he snores but when he doesn't use it she does not snore.  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 Please keep upcoming appt in September with Dr. Radford Pax before anymore refills. Thank you   doxazosin (CARDURA) 2  MG tablet Take 1 tablet (2 mg total) by mouth daily for 365 doses.   gabapentin (NEURONTIN) 300 MG capsule Take 1 capsule (300 mg total) by mouth 3 (three) times daily.   rosuvastatin (CRESTOR) 40 MG tablet Take 1 tablet (40 mg total) by mouth daily.   valsartan-hydrochlorothiazide (DIOVAN-HCT) 320-25 MG tablet Take 1 tablet by mouth daily. OVERDUE FOR FOLLOW UP. PLEASE CALL AND SCHEDULE [phone]- 1ST ATTEMPT     Allergies:   Amlodipine, Norvasc [amlodipine besylate], and Prednisone   Social History   Tobacco Use   Smoking status: Never Smoker   Smokeless tobacco: Never Used  Substance Use Topics   Alcohol use: Yes    Comment: occassional wine   Drug use: No     Family Hx: The patient's family history includes Breast cancer in his mother and sister; Colon polyps in his brother and mother; Diabetes in his brother; Hypertension in his brother; Kidney failure in his father. There is no history of Colon cancer, Stomach cancer, Rectal cancer, Esophageal cancer, or Liver cancer.  ROS:   Please see the history of present illness.     All other systems reviewed and are negative.   Labs/Other Tests and Data Reviewed:    Recent Labs: 04/05/2019:  ALT 45   Recent Lipid Panel Lab Results  Component Value Date/Time   CHOL 158 03/04/2019 12:00 AM   TRIG 223 (H) 03/04/2019 12:00 AM   HDL 53 03/04/2019 12:00 AM   CHOLHDL 3.0 03/04/2019 12:00 AM   CHOLHDL 3.6 05/13/2015 08:44 AM   LDLCALC 69 03/04/2019 12:00 AM    Wt Readings from Last 3 Encounters:  11/08/19 273 lb 12.8 oz (124.2 kg)  07/10/18 270 lb (122.5 kg)  04/10/17 274 lb (124.3 kg)     Objective:    Vital Signs:  BP 140/86    Pulse 76    Ht 5\' 11"  (1.803 m)    Wt 273 lb 12.8 oz (124.2 kg)    SpO2 96%    BMI 38.19 kg/m    GEN: Well nourished, well developed in no acute distress HEENT: Normal NECK: No JVD; No carotid bruits LYMPHATICS: No lymphadenopathy CARDIAC:RRR, no murmurs, rubs, gallops RESPIRATORY:   Clear to auscultation without rales, wheezing or rhonchi  ABDOMEN: Soft, non-tender, non-distended MUSCULOSKELETAL:  No edema; No deformity  SKIN: Warm and dry NEUROLOGIC:  Alert and oriented x 3 PSYCHIATRIC:  Normal affect    ASSESSMENT & PLAN:    1.  OSA -  The patient is tolerating PAP therapy well without any problems. The PAP download was reviewed today and showed an AHI of 1.5/hr on 12 cm H2O with 40% compliance in using more than 4 hours nightly.  The patient has been using and benefiting from PAP use and will continue to benefit from therapy.   2.  Hypertension  -BP controlled on exam today -continue doxazosin 2mg  daily and Valsartan-HCT 320-25mg  daily and Carvedilol 25mg  BID -SCr 1.02 in May 2021  3.  Obesity  -I have encouraged him to get into a routine exercise program and cut back on carbs and portions.   4.  Ascending aortic aneurysm -4.5cm by Chest CT in 2018 -he was supposed to have followed up scan but did not follow through due to COVID 19 -I will get a chest CTA and also get a coronary calcium score to assess cardiac risk  5.  HLD -LDL goal is < 70 -LDL was 71 in May 2021 -continue Crestor 40mg  daily   Medication Adjustments/Labs and Tests Ordered: Current medicines are reviewed at length with the patient today.  Concerns regarding medicines are outlined above.  Tests Ordered: Orders Placed This Encounter  Procedures   EKG 12-Lead   Medication Changes: No orders of the defined types were placed in this encounter.   Disposition:  Follow up in 1 year(s)  Signed, Fransico Him, MD  11/08/2019 2:12 PM     [facility]

## 2019-11-08 ENCOUNTER — Ambulatory Visit: Payer: 59 | Admitting: Cardiology

## 2019-11-08 ENCOUNTER — Other Ambulatory Visit: Payer: Self-pay

## 2019-11-08 ENCOUNTER — Encounter: Payer: Self-pay | Admitting: Cardiology

## 2019-11-08 VITALS — BP 140/86 | HR 76 | Ht 71.0 in | Wt 273.8 lb

## 2019-11-08 DIAGNOSIS — G4733 Obstructive sleep apnea (adult) (pediatric): Secondary | ICD-10-CM | POA: Diagnosis not present

## 2019-11-08 DIAGNOSIS — I10 Essential (primary) hypertension: Secondary | ICD-10-CM | POA: Diagnosis not present

## 2019-11-08 DIAGNOSIS — I712 Thoracic aortic aneurysm, without rupture, unspecified: Secondary | ICD-10-CM

## 2019-11-08 DIAGNOSIS — E669 Obesity, unspecified: Secondary | ICD-10-CM

## 2019-11-08 NOTE — Addendum Note (Signed)
Addended by: Antonieta Iba on: 11/08/2019 02:29 PM   Modules accepted: Orders

## 2019-11-08 NOTE — Addendum Note (Signed)
Addended by: Antonieta Iba on: 11/08/2019 02:36 PM   Modules accepted: Orders

## 2019-11-08 NOTE — Patient Instructions (Signed)
Medication Instructions:  Your physician recommends that you continue on your current medications as directed. Please refer to the Current Medication list given to you today.  *If you need a refill on your cardiac medications before your next appointment, please call your pharmacy*   Testing/Procedures: Your physician has recommended that your have a Chest CTA scan and a Calcium Score CT scan.  Follow-Up: At [facility], you and your health needs are our priority.  As part of our continuing mission to provide you with exceptional heart care, we have created designated Provider Care Teams.  These Care Teams include your primary Cardiologist (physician) and Advanced Practice Providers (APPs -  Physician Assistants and Nurse Practitioners) who all work together to provide you with the care you need, when you need it.  Your next appointment:   1 year(s)  The format for your next appointment:   In Person  Provider:   You may see Fransico Him, MD or one of the following Advanced Practice Providers on your designated Care Team:    Melina Copa, PA-C  Ermalinda Barrios, PA-C

## 2019-11-09 LAB — BASIC METABOLIC PANEL
BUN/Creatinine Ratio: 21 (ref 10–24)
BUN: 20 mg/dL (ref 8–27)
CO2: 28 mmol/L (ref 20–29)
Calcium: 9.6 mg/dL (ref 8.6–10.2)
Chloride: 100 mmol/L (ref 96–106)
Creatinine, Ser: 0.97 mg/dL (ref 0.76–1.27)
GFR calc Af Amer: 96 mL/min/{1.73_m2} (ref >59)
GFR calc non Af Amer: 83 mL/min/{1.73_m2} (ref >59)
Glucose: 95 mg/dL (ref 65–99)
Potassium: 4.1 mmol/L (ref 3.5–5.2)
Sodium: 139 mmol/L (ref 134–144)

## 2019-11-19 ENCOUNTER — Ambulatory Visit
Admission: RE | Admit: 2019-11-19 | Discharge: 2019-11-19 | Disposition: A | Discharge status: Home / Self Care | Payer: Self-pay | Source: Ambulatory Visit | Attending: Cardiology | Admitting: Cardiology

## 2019-11-19 ENCOUNTER — Encounter: Payer: Self-pay | Admitting: Cardiology

## 2019-11-19 ENCOUNTER — Other Ambulatory Visit: Payer: Self-pay

## 2019-11-19 ENCOUNTER — Ambulatory Visit (INDEPENDENT_AMBULATORY_CARE_PROVIDER_SITE_OTHER)
Admission: RE | Admit: 2019-11-19 | Discharge: 2019-11-19 | Disposition: A | Discharge status: Home / Self Care | Payer: 59 | Source: Ambulatory Visit | Attending: Cardiology | Admitting: Cardiology

## 2019-11-19 DIAGNOSIS — I7121 Aneurysm of the ascending aorta, without rupture: Secondary | ICD-10-CM | POA: Insufficient documentation

## 2019-11-19 DIAGNOSIS — I712 Thoracic aortic aneurysm, without rupture, unspecified: Secondary | ICD-10-CM

## 2019-11-19 DIAGNOSIS — I2510 Atherosclerotic heart disease of native coronary artery without angina pectoris: Secondary | ICD-10-CM | POA: Insufficient documentation

## 2019-11-19 DIAGNOSIS — I700 Atherosclerosis of aorta: Secondary | ICD-10-CM | POA: Insufficient documentation

## 2019-11-19 HISTORY — DX: Thoracic aortic aneurysm, without rupture: I71.2

## 2019-11-19 HISTORY — DX: Atherosclerotic heart disease of native coronary artery without angina pectoris: I25.10

## 2019-11-19 HISTORY — DX: Aneurysm of the ascending aorta, without rupture: I71.21

## 2019-11-19 HISTORY — DX: Atherosclerosis of aorta: I70.0

## 2019-11-19 MED ORDER — IOHEXOL 350 MG/ML SOLN
100.0000 mL | Freq: Once | INTRAVENOUS | Status: AC | PRN
  Administered 2019-11-19: 100 mL via INTRAVENOUS

## 2019-11-20 ENCOUNTER — Telehealth: Payer: Self-pay

## 2019-11-20 ENCOUNTER — Other Ambulatory Visit: Payer: Self-pay | Admitting: Cardiology

## 2019-11-20 ENCOUNTER — Ambulatory Visit
Admission: RE | Admit: 2019-11-20 | Discharge: 2019-11-20 | Disposition: A | Discharge status: Home / Self Care | Payer: 59 | Source: Ambulatory Visit | Attending: Cardiology | Admitting: Cardiology

## 2019-11-20 DIAGNOSIS — I712 Thoracic aortic aneurysm, without rupture, unspecified: Secondary | ICD-10-CM

## 2019-11-20 DIAGNOSIS — I2510 Atherosclerotic heart disease of native coronary artery without angina pectoris: Secondary | ICD-10-CM

## 2019-11-20 DIAGNOSIS — I700 Atherosclerosis of aorta: Secondary | ICD-10-CM

## 2019-11-20 NOTE — Telephone Encounter (Signed)
The patient has been notified of the result and verbalized understanding.  All questions (if any) were answered. Antonieta Iba, RN 11/20/2019 11:25 AM  Patient is having coronary calcium score done today. Orders have been placed for lexiscan myoview as well as repeat Chest CTA in one year.

## 2019-11-20 NOTE — Telephone Encounter (Signed)
-----   Message from Sueanne Margarita, MD sent at 11/19/2019  8:29 PM EDT ----- Stable mild to moderate sized ascending aortic aneurysm at 22mm unchanged from 2018.  There were also coronary artery calcifications and aortic atherosclerosis.  Please get a coronary calcium score and a Lexiscan myoview.  Repeat Chest CTA in 1 year

## 2019-11-21 ENCOUNTER — Encounter: Payer: Self-pay | Admitting: Cardiology

## 2019-11-21 ENCOUNTER — Telehealth (HOSPITAL_COMMUNITY): Payer: Self-pay

## 2019-11-21 NOTE — Telephone Encounter (Signed)
Spoke with the patient, detailed instructions given. He stated that he understood and would be here for his test. Asked to cal back with any questions. Bebe Liter EMTP

## 2019-11-25 ENCOUNTER — Other Ambulatory Visit: Payer: Self-pay | Admitting: Cardiology

## 2019-11-26 ENCOUNTER — Ambulatory Visit (HOSPITAL_COMMUNITY): Payer: 59 | Attending: Cardiovascular Disease

## 2019-11-26 ENCOUNTER — Other Ambulatory Visit: Payer: Self-pay

## 2019-11-26 DIAGNOSIS — I712 Thoracic aortic aneurysm, without rupture, unspecified: Secondary | ICD-10-CM

## 2019-11-26 DIAGNOSIS — I700 Atherosclerosis of aorta: Secondary | ICD-10-CM

## 2019-11-26 DIAGNOSIS — I2510 Atherosclerotic heart disease of native coronary artery without angina pectoris: Secondary | ICD-10-CM | POA: Diagnosis present

## 2019-11-26 LAB — MYOCARDIAL PERFUSION IMAGING
LV dias vol: 88 mL (ref 62–150)
LV sys vol: 36 mL
Peak HR: 85 {beats}/min
Rest HR: 65 {beats}/min
SDS: 1
SRS: 0
SSS: 1
TID: 0.96

## 2019-11-26 MED ORDER — REGADENOSON 0.4 MG/5ML IV SOLN
0.4000 mg | Freq: Once | INTRAVENOUS | Status: AC
  Administered 2019-11-26: 0.4 mg via INTRAVENOUS

## 2019-11-26 MED ORDER — TECHNETIUM TC 99M TETROFOSMIN IV KIT
31.6000 | PACK | Freq: Once | INTRAVENOUS | Status: AC | PRN
  Administered 2019-11-26: 31.6 via INTRAVENOUS
  Filled 2019-11-26: qty 32

## 2019-11-26 MED ORDER — TECHNETIUM TC 99M TETROFOSMIN IV KIT
10.1000 | PACK | Freq: Once | INTRAVENOUS | Status: AC | PRN
  Administered 2019-11-26: 10.1 via INTRAVENOUS
  Filled 2019-11-26: qty 11

## 2019-11-27 ENCOUNTER — Encounter: Payer: Self-pay | Admitting: Cardiology

## 2019-11-27 ENCOUNTER — Encounter: Payer: Self-pay | Admitting: Gastroenterology

## 2019-12-16 ENCOUNTER — Other Ambulatory Visit: Payer: Self-pay | Admitting: Cardiology

## 2019-12-21 ENCOUNTER — Other Ambulatory Visit: Payer: Self-pay | Admitting: Cardiology

## 2020-01-08 ENCOUNTER — Ambulatory Visit (AMBULATORY_SURGERY_CENTER): Payer: Self-pay | Admitting: *Deleted

## 2020-01-08 ENCOUNTER — Encounter: Payer: Self-pay | Admitting: Gastroenterology

## 2020-01-08 ENCOUNTER — Other Ambulatory Visit: Payer: Self-pay

## 2020-01-08 VITALS — Ht 71.0 in | Wt 272.0 lb

## 2020-01-08 DIAGNOSIS — Z1211 Encounter for screening for malignant neoplasm of colon: Secondary | ICD-10-CM

## 2020-01-08 MED ORDER — NA SULFATE-K SULFATE-MG SULF 17.5-3.13-1.6 GM/177ML PO SOLN: ORAL | 0 refills | Status: DC

## 2020-01-08 NOTE — Progress Notes (Signed)
Patient is here in-person for PV. Patient denies any allergies to eggs or soy. Patient denies any problems with anesthesia/sedation. Patient denies any oxygen use at home. Patient denies taking any diet/weight loss medications or blood thinners. Patient is not being treated for MRSA or C-diff. Patient is aware of our care-partner policy and VGVSY-54 safety protocol. EMMI education assigned to the patient for the procedure, sent to [facility].   COVID-19 vaccines completed on 05/29/2019, per patient.   Prep Prescription coupon given to the patient.

## 2020-01-21 ENCOUNTER — Ambulatory Visit (AMBULATORY_SURGERY_CENTER): Payer: 59 | Admitting: Gastroenterology

## 2020-01-21 ENCOUNTER — Encounter: Payer: Self-pay | Admitting: Gastroenterology

## 2020-01-21 ENCOUNTER — Other Ambulatory Visit: Payer: Self-pay

## 2020-01-21 ENCOUNTER — Other Ambulatory Visit: Payer: Self-pay | Admitting: Gastroenterology

## 2020-01-21 VITALS — BP 141/66 | HR 67 | Temp 97.0°F | Resp 24 | Ht 71.0 in | Wt 272.0 lb

## 2020-01-21 DIAGNOSIS — K639 Disease of intestine, unspecified: Secondary | ICD-10-CM | POA: Diagnosis not present

## 2020-01-21 DIAGNOSIS — K6289 Other specified diseases of anus and rectum: Secondary | ICD-10-CM | POA: Diagnosis not present

## 2020-01-21 DIAGNOSIS — D120 Benign neoplasm of cecum: Secondary | ICD-10-CM

## 2020-01-21 DIAGNOSIS — D123 Benign neoplasm of transverse colon: Secondary | ICD-10-CM | POA: Diagnosis not present

## 2020-01-21 DIAGNOSIS — Z1211 Encounter for screening for malignant neoplasm of colon: Secondary | ICD-10-CM

## 2020-01-21 MED ORDER — SODIUM CHLORIDE 0.9 % IV SOLN: 500.0000 mL | Freq: Once | INTRAVENOUS | Status: AC

## 2020-01-21 NOTE — Progress Notes (Signed)
Lidocaine 2% 64ml IV given as per Dr. Havery Moros.

## 2020-01-21 NOTE — Patient Instructions (Signed)
Handouts on polyps, hemorrhoids and diverticulosis provided. Resume previous diet and medications as before. Await pathology results from Dr Havery Moros.   YOU HAD AN ENDOSCOPIC PROCEDURE TODAY AT [facility]:   Refer to the procedure report that was given to you for any specific questions about what was found during the examination.  If the procedure report does not answer your questions, please call your gastroenterologist to clarify.  If you requested that your care partner not be given the details of your procedure findings, then the procedure report has been included in a sealed envelope for you to review at your convenience later.  YOU SHOULD EXPECT: Some feelings of bloating in the abdomen. Passage of more gas than usual.  Walking can help get rid of the air that was put into your GI tract during the procedure and reduce the bloating. If you had a lower endoscopy (such as a colonoscopy or flexible sigmoidoscopy) you may notice spotting of blood in your stool or on the toilet paper. If you underwent a bowel prep for your procedure, you may not have a normal bowel movement for a few days.  Please Note:  You might notice some irritation and congestion in your nose or some drainage.  This is from the oxygen used during your procedure.  There is no need for concern and it should clear up in a day or so.  SYMPTOMS TO REPORT IMMEDIATELY:   Following lower endoscopy (colonoscopy or flexible sigmoidoscopy):  Excessive amounts of blood in the stool  Significant tenderness or worsening of abdominal pains  Swelling of the abdomen that is new, acute  Fever of 100F or higher    For urgent or emergent issues, a gastroenterologist can be reached at any hour by calling [phone]. Do not use MyChart messaging for urgent concerns.    DIET:  We do recommend a small meal at first, but then you may proceed to your regular diet.  Drink plenty of fluids but you should avoid alcoholic  beverages for 24 hours.  ACTIVITY:  You should plan to take it easy for the rest of today and you should NOT DRIVE or use heavy machinery until tomorrow (because of the sedation medicines used during the test).    FOLLOW UP: Our staff will call the number listed on your records 48-72 hours following your procedure to check on you and address any questions or concerns that you may have regarding the information given to you following your procedure. If we do not reach you, we will leave a message.  We will attempt to reach you two times.  During this call, we will ask if you have developed any symptoms of COVID 19. If you develop any symptoms (ie: fever, flu-like symptoms, shortness of breath, cough etc.) before then, please call [phone].  If you test positive for Covid 19 in the 2 weeks post procedure, please call and report this information to [place].    If any biopsies were taken you will be contacted by phone or by letter within the next 1-3 weeks.  Please call us at [phone] if you have not heard about the biopsies in 3 weeks.    SIGNATURES/CONFIDENTIALITY: You and/or your care partner have signed paperwork which will be entered into your electronic medical record.  These signatures attest to the fact that that the information above on your After Visit Summary has been reviewed and is understood.  Full responsibility of the confidentiality of this discharge information lies  with you and/or your care-partner.

## 2020-01-21 NOTE — Op Note (Signed)
[facility] Patient Name: Patrick Gilmore Procedure Date: 01/21/2020 8:04 AM MRN: [id number] Endoscopist: Remo Lipps P. Havery Moros , MD Age: 64 Referring MD:  Date of Birth: [birth year] Gender: Male Account #: [id number] Procedure:                Colonoscopy Indications:              Screening for colorectal malignant neoplasm Medicines:                Monitored Anesthesia Care Procedure:                Pre-Anesthesia Assessment:                           - Prior to the procedure, a History and Physical                            was performed, and patient medications and                            allergies were reviewed. The patient's tolerance of                            previous anesthesia was also reviewed. The risks                            and benefits of the procedure and the sedation                            options and risks were discussed with the patient.                            All questions were answered, and informed consent                            was obtained. Prior Anticoagulants: The patient has                            taken no previous anticoagulant or antiplatelet                            agents. ASA Grade Assessment: III - A patient with                            severe systemic disease. After reviewing the risks                            and benefits, the patient was deemed in                            satisfactory condition to undergo the procedure.                           After obtaining informed consent, the colonoscope  was passed under direct vision. Throughout the                            procedure, the patient's blood pressure, pulse, and                            oxygen saturations were monitored continuously. The                            Colonoscope was introduced through the anus and                            advanced to the the cecum, identified by                            appendiceal orifice  and ileocecal valve. The                            colonoscopy was performed without difficulty. The                            patient tolerated the procedure well. The quality                            of the bowel preparation was good. The ileocecal                            valve, appendiceal orifice, and rectum were                            photographed. Scope In: 8:10:38 AM Scope Out: 8:35:23 AM Scope Withdrawal Time: 0 hours 21 minutes 29 seconds  Total Procedure Duration: 0 hours 24 minutes 45 seconds  Findings:                 The perianal and digital rectal examinations were                            normal.                           A 3 mm polyp was found in the cecum. The polyp was                            sessile. The polyp was removed with a cold snare.                            Resection and retrieval were complete.                           A 3 to 4 mm polyp was found in the transverse                            colon. The polyp was sessile. The polyp was removed  with a cold snare. Resection and retrieval were                            complete.                           A patchy area of mildly erythematous mucosa was                            found in the transverse colon. Biopsies were taken                            with a cold forceps for histology.                           Multiple small-mouthed diverticula were found in                            the sigmoid colon.                           Anal papilla(e) were hypertrophied. Biopsies were                            taken with a cold forceps for histology.                           Internal hemorrhoids were found during retroflexion.                           There was restricted mobility in the proximal                            sigmoid colon which prolonged this exam. The exam                            was otherwise without abnormality. Complications:            No  immediate complications. Estimated blood loss:                            Minimal. Estimated Blood Loss:     Estimated blood loss was minimal. Impression:               - One 3 mm polyp in the cecum, removed with a cold                            snare. Resected and retrieved.                           - One 3 to 4 mm polyp in the transverse colon,                            removed with a cold snare. Resected and retrieved.                           -  Erythematous mucosa in the transverse colon.                            Biopsied.                           - Diverticulosis in the sigmoid colon.                           - Restricted mobility in the sigmoid colon.                           - Anal papilla(e) were hypertrophied. Biopsied.                           - Internal hemorrhoids.                           - The examination was otherwise normal. Recommendation:           - Patient has a contact number available for                            emergencies. The signs and symptoms of potential                            delayed complications were discussed with the                            patient. Return to normal activities tomorrow.                            Written discharge instructions were provided to the                            patient.                           - Resume previous diet.                           - Continue present medications.                           - Await pathology results. Remo Lipps P. Kacen Mellinger, MD 01/21/2020 8:42:05 AM This report has been signed electronically.

## 2020-01-21 NOTE — Progress Notes (Signed)
Called to room to assist during endoscopic procedure.  Patient ID and intended procedure confirmed with present staff. Received instructions for my participation in the procedure from the performing physician.  

## 2020-01-21 NOTE — Progress Notes (Signed)
pt tolerated well. VSS. awake and to recovery. Report given to RN.  

## 2020-01-21 NOTE — Progress Notes (Signed)
No changes in health hx since previsit with RM.  VS done by MO

## 2020-01-22 ENCOUNTER — Telehealth: Payer: Self-pay | Admitting: *Deleted

## 2020-01-22 NOTE — Telephone Encounter (Signed)
Follow up call made, left message. 

## 2020-02-12 ENCOUNTER — Other Ambulatory Visit: Payer: Self-pay | Admitting: Cardiology

## 2020-03-15 ENCOUNTER — Other Ambulatory Visit: Payer: Self-pay | Admitting: Cardiology

## 2020-03-19 ENCOUNTER — Other Ambulatory Visit: Payer: Self-pay | Admitting: Cardiology

## 2020-04-04 ENCOUNTER — Other Ambulatory Visit: Payer: Self-pay | Admitting: Cardiology

## 2020-06-16 ENCOUNTER — Other Ambulatory Visit: Payer: Self-pay | Admitting: *Deleted

## 2020-06-16 MED ORDER — CARVEDILOL 25 MG PO TABS
25.0000 mg | ORAL_TABLET | Freq: Two times a day (BID) | ORAL | 1 refills | Status: DC
Start: 2020-06-16 — End: 2020-11-25

## 2020-06-30 ENCOUNTER — Other Ambulatory Visit: Payer: Self-pay | Admitting: Cardiology

## 2020-10-20 ENCOUNTER — Other Ambulatory Visit: Payer: Self-pay | Admitting: *Deleted

## 2020-10-20 DIAGNOSIS — I712 Thoracic aortic aneurysm, without rupture, unspecified: Secondary | ICD-10-CM

## 2020-11-13 ENCOUNTER — Other Ambulatory Visit: Payer: Self-pay

## 2020-11-13 ENCOUNTER — Other Ambulatory Visit: Payer: 59 | Admitting: *Deleted

## 2020-11-13 DIAGNOSIS — I712 Thoracic aortic aneurysm, without rupture, unspecified: Secondary | ICD-10-CM

## 2020-11-14 LAB — BASIC METABOLIC PANEL
BUN/Creatinine Ratio: 17 (ref 10–24)
BUN: 17 mg/dL (ref 8–27)
CO2: 23 mmol/L (ref 20–29)
Calcium: 9.7 mg/dL (ref 8.6–10.2)
Chloride: 101 mmol/L (ref 96–106)
Creatinine, Ser: 1.03 mg/dL (ref 0.76–1.27)
Glucose: 131 mg/dL — ABNORMAL HIGH (ref 65–99)
Potassium: 4.2 mmol/L (ref 3.5–5.2)
Sodium: 140 mmol/L (ref 134–144)
eGFR: 81 mL/min/{1.73_m2} (ref >59)

## 2020-11-16 IMAGING — CT CT CARDIAC CORONARY ARTERY CALCIUM SCORE
3 series · 14 of 20 positions shown, 15 images · non-contrast
Comparison: None.
COMPARISON: None.

Addendum:
EXAM:
OVER-READ INTERPRETATION  CT CHEST

The following report is an over-read performed by radiologist Dr.
Quirijn Amazigh [REDACTED] on 11/20/2019. This
over-read does not include interpretation of cardiac or coronary
anatomy or pathology. The coronary calcium score interpretation by
the cardiologist is attached.
CLINICAL DATA: Risk stratification
Coronary Calcium Score
TECHNIQUE: The patient was scanned on a Siemens Force scanner. Axial
non-contrast 3 mm slices were carried out through the heart. The
data set was analyzed on a dedicated work station and scored using
the Agatson method.

[Series 2: casc 3.0 bv41 2 bestdiast 73 % · axial · 0.53mm/px · z∈[-249,-168]mm · 4 of 45 slices shown, 5 images]
[im 9/45  vessel]
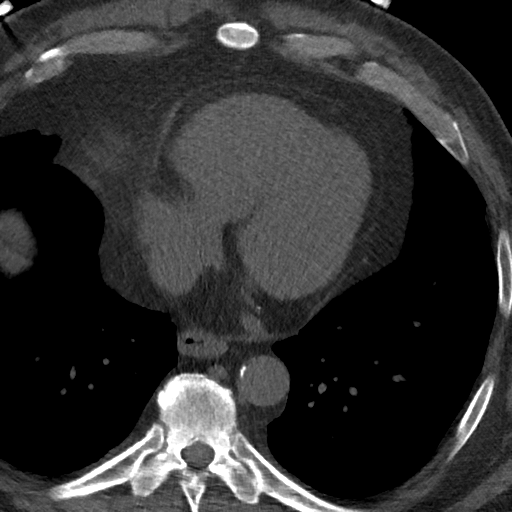
[im 9/45  lung]
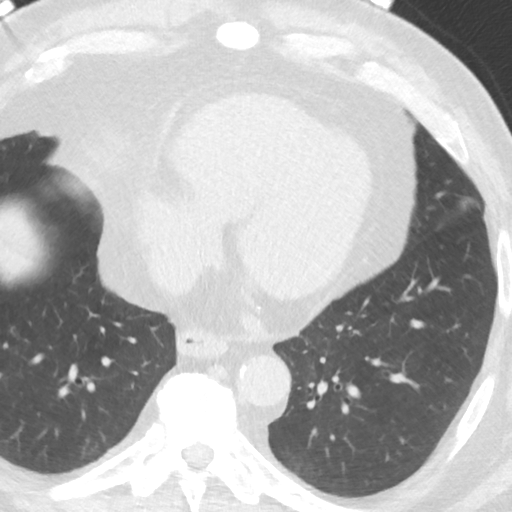
[im 18/45  vessel]
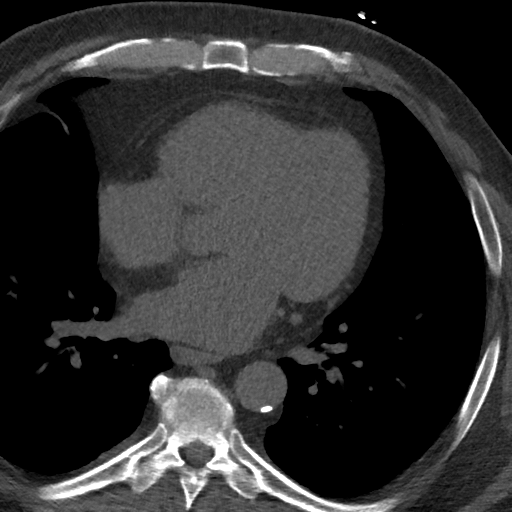
[im 27/45  vessel]
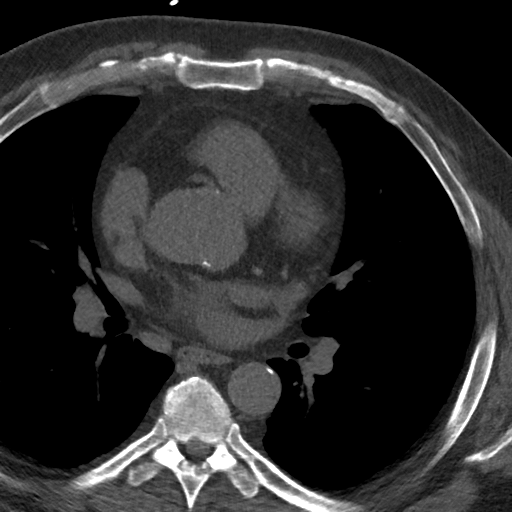
[im 36/45  vessel]
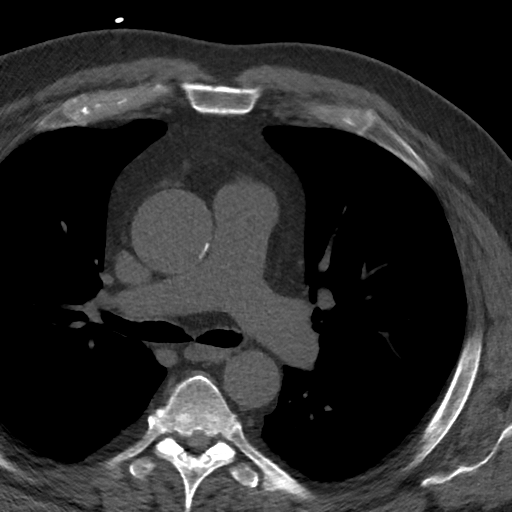

[Series 3: lung 73 % · axial · 0.81mm/px · z∈[-252,-165]mm · 5 of 45 slices shown]
[im 8/45  lung]
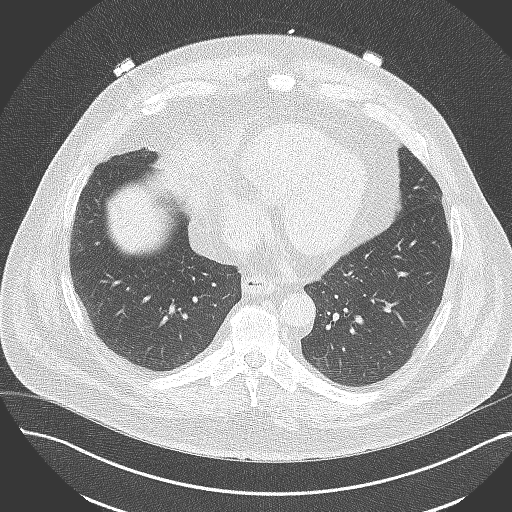
[im 15/45  lung]
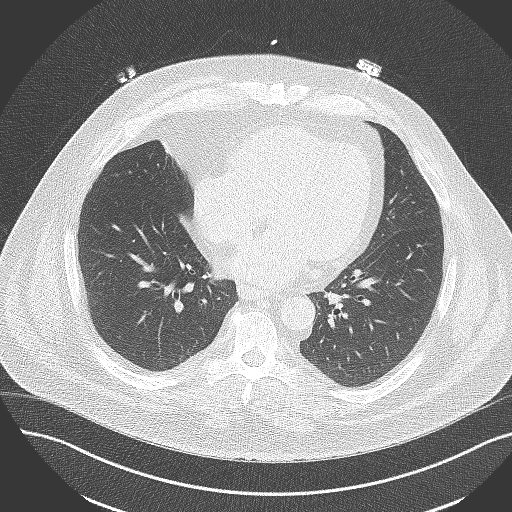
[im 23/45  lung]
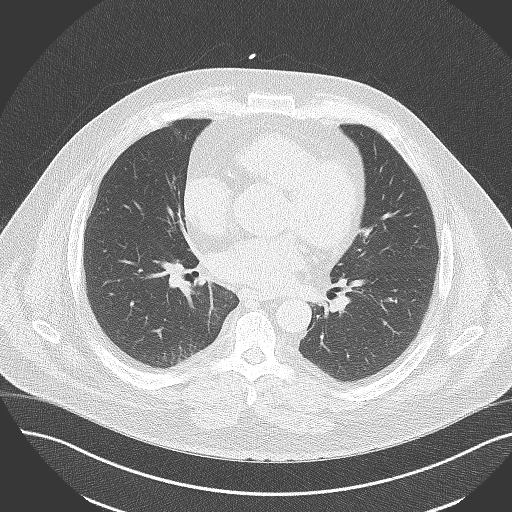
[im 30/45  lung]
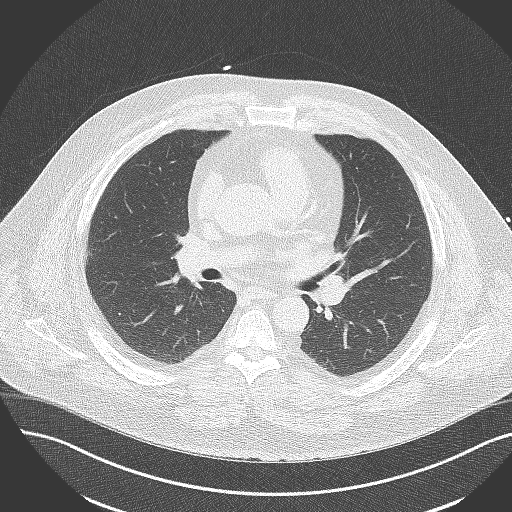
[im 37/45  lung]
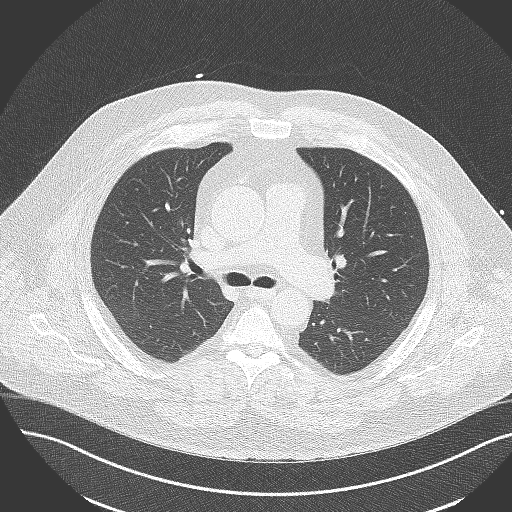

[Series 4: lung st 73 % · axial · 0.81mm/px · z∈[-252,-165]mm · 5 of 45 slices shown]
[im 8/45  lung]
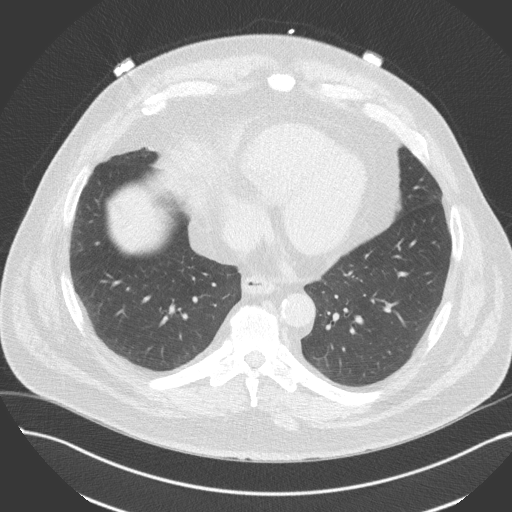
[im 15/45  lung]
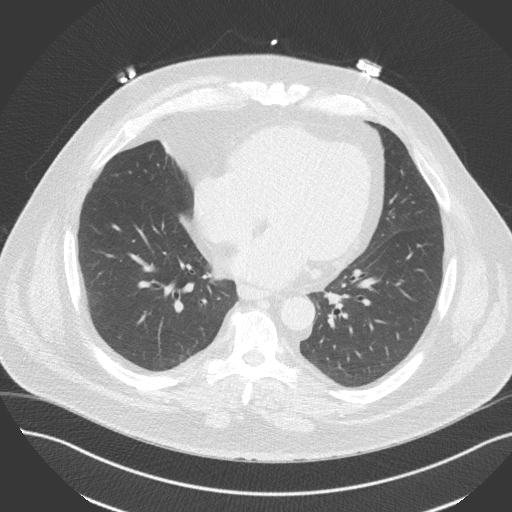
[im 23/45  lung]
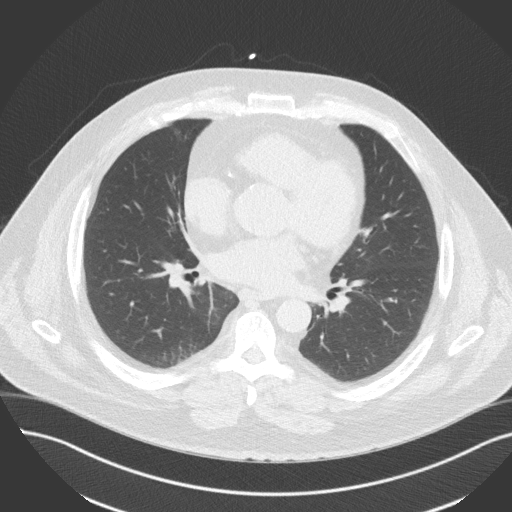
[im 30/45  lung]
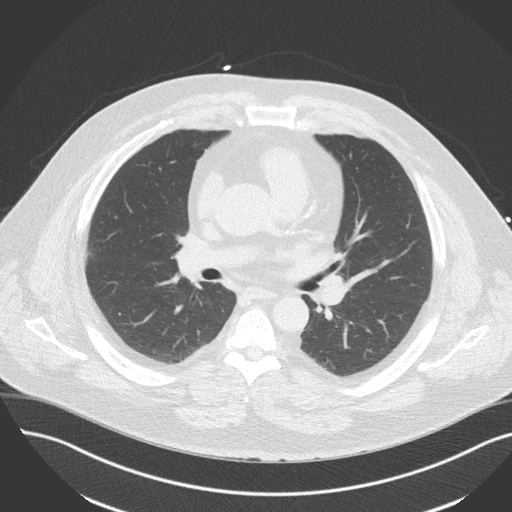
[im 37/45  lung]
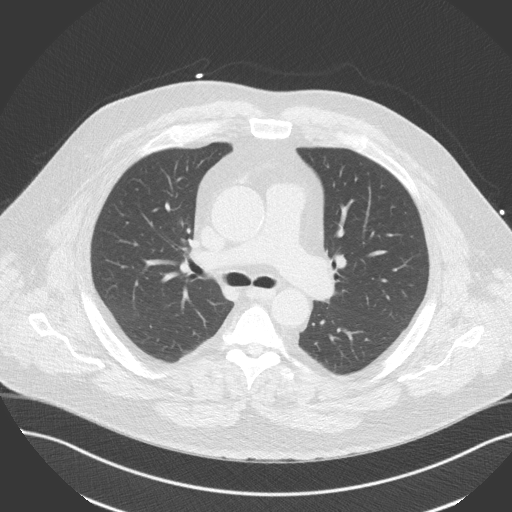

[14 of 20 positions shown; findings below may reference images not displayed]

FINDINGS: Aortic atherosclerosis. Within the visualized portions of the thorax
there are no suspicious appearing pulmonary nodules or masses, there
is no acute consolidative airspace disease, no pleural effusions, no
pneumothorax and no lymphadenopathy. Visualized portions of the
upper abdomen demonstrates diffuse low attenuation throughout the
visualized hepatic parenchyma, indicative of hepatic steatosis.
There are no aggressive appearing lytic or blastic lesions noted in
the visualized portions of the skeleton.
IMPRESSION: 1.  Aortic Atherosclerosis (HX8CZ-6Y9.9).
2. Hepatic steatosis.
FINDINGS: Non-cardiac: See separate report from [REDACTED].

Ascending Aorta: Dilated, see CTA on 11/19/19 for measurements

Pericardium: Normal

Coronary arteries: Calcium score 102
IMPRESSION: Coronary calcium score of 102. This was 61st percentile for age and
sex matched control.

*** End of Addendum ***
EXAM:
OVER-READ INTERPRETATION  CT CHEST

The following report is an over-read performed by radiologist Dr.
Quirijn Amazigh [REDACTED] on 11/20/2019. This
over-read does not include interpretation of cardiac or coronary
anatomy or pathology. The coronary calcium score interpretation by
the cardiologist is attached.
FINDINGS: Aortic atherosclerosis. Within the visualized portions of the thorax
there are no suspicious appearing pulmonary nodules or masses, there
is no acute consolidative airspace disease, no pleural effusions, no
pneumothorax and no lymphadenopathy. Visualized portions of the
upper abdomen demonstrates diffuse low attenuation throughout the
visualized hepatic parenchyma, indicative of hepatic steatosis.
There are no aggressive appearing lytic or blastic lesions noted in
the visualized portions of the skeleton.
IMPRESSION: 1.  Aortic Atherosclerosis (HX8CZ-6Y9.9).
2. Hepatic steatosis.

## 2020-11-18 ENCOUNTER — Ambulatory Visit (INDEPENDENT_AMBULATORY_CARE_PROVIDER_SITE_OTHER)
Admission: RE | Admit: 2020-11-18 | Discharge: 2020-11-18 | Disposition: A | Discharge status: Home / Self Care | Payer: 59 | Source: Ambulatory Visit | Attending: Cardiology | Admitting: Cardiology

## 2020-11-18 ENCOUNTER — Other Ambulatory Visit: Payer: Self-pay

## 2020-11-18 DIAGNOSIS — I712 Thoracic aortic aneurysm, without rupture, unspecified: Secondary | ICD-10-CM

## 2020-11-18 MED ORDER — IOHEXOL 350 MG/ML SOLN
100.0000 mL | Freq: Once | INTRAVENOUS | Status: AC | PRN
  Administered 2020-11-18: 100 mL via INTRAVENOUS

## 2020-11-25 ENCOUNTER — Other Ambulatory Visit: Payer: Self-pay

## 2020-11-25 ENCOUNTER — Ambulatory Visit: Payer: 59 | Admitting: Cardiology

## 2020-11-25 ENCOUNTER — Encounter: Payer: Self-pay | Admitting: Cardiology

## 2020-11-25 VITALS — BP 118/78 | HR 73 | Ht 71.0 in | Wt 271.0 lb

## 2020-11-25 DIAGNOSIS — E7800 Pure hypercholesterolemia, unspecified: Secondary | ICD-10-CM

## 2020-11-25 DIAGNOSIS — I712 Thoracic aortic aneurysm, without rupture: Secondary | ICD-10-CM

## 2020-11-25 DIAGNOSIS — E669 Obesity, unspecified: Secondary | ICD-10-CM | POA: Diagnosis not present

## 2020-11-25 DIAGNOSIS — I7121 Aneurysm of the ascending aorta, without rupture: Secondary | ICD-10-CM

## 2020-11-25 DIAGNOSIS — I10 Essential (primary) hypertension: Secondary | ICD-10-CM | POA: Diagnosis not present

## 2020-11-25 DIAGNOSIS — R931 Abnormal findings on diagnostic imaging of heart and coronary circulation: Secondary | ICD-10-CM

## 2020-11-25 DIAGNOSIS — G4733 Obstructive sleep apnea (adult) (pediatric): Secondary | ICD-10-CM

## 2020-11-25 MED ORDER — CARVEDILOL 25 MG PO TABS
25.0000 mg | ORAL_TABLET | Freq: Two times a day (BID) | ORAL | 3 refills | Status: DC
Start: 2020-11-25 — End: 2021-12-27

## 2020-11-25 MED ORDER — DOXAZOSIN MESYLATE 2 MG PO TABS
2.0000 mg | ORAL_TABLET | Freq: Every day | ORAL | 3 refills | Status: DC
Start: 2020-11-25 — End: 2021-01-04

## 2020-11-25 MED ORDER — ROSUVASTATIN CALCIUM 40 MG PO TABS: 40.0000 mg | ORAL_TABLET | Freq: Every day | ORAL | 3 refills | Status: DC

## 2020-11-25 MED ORDER — VALSARTAN-HYDROCHLOROTHIAZIDE 320-25 MG PO TABS: 1.0000 | ORAL_TABLET | Freq: Every day | ORAL | 3 refills | Status: DC

## 2020-11-25 NOTE — Addendum Note (Signed)
Addended by: Antonieta Iba on: 11/25/2020 11:01 AM   Modules accepted: Orders

## 2020-11-25 NOTE — Progress Notes (Signed)
Date:  11/25/2020   ID:  DELBERT Gilmore, DOB Feb 15, 1956, MRN [id number]  PCP:  Aretta Nip, MD  Cardiologist:  Fransico Him, MD Electrophysiologist:  None   Chief Complaint:  OSA, HTN  History of Present Illness:    Patrick Gilmore is a 65 y.o. male with a hx of HTN, obesity, dyslipidemia and OSA on CPAP. He has a hx of moderate ascending aortic aneurysm by CT in 2018.  This also showed some calcifications in the coronary arteries.  He has not had any chest pain although he says when he does not use his PAP device his chest wall  Pain but has not chest pressure or discomfort with exertion and no DOE.   He has been having problems with dry mouth and not tolerating the FFM as much as he would like.  He says that he runs out of water at night.  He feels the pressure is adequate. When he uses his CPAP he feels rested in the am and has no significant daytime sleepiness.  When he does not use CPAP he will have some chest tightness.   He is here today for followup and is doing well.  He denies any chest pain or pressure, SOB, DOE, PND, orthopnea, LE edema (except traveling long distances), dizziness, palpitations or syncope. He is compliant with his meds and is tolerating meds with no SE.     Prior CV studies:   The following studies were reviewed today:  PAP compliance download  Past Medical History:  Diagnosis Date   Aortic atherosclerosis ([place])    Ascending aortic aneurysm ([place])    74mm by Chest CTA 10/2019   Coronary artery calcification seen on CAT scan    coronary Ca score 102 on 10/2019.  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 Swallow whole.   carvedilol (COREG) 25 MG tablet Take 1 tablet (25 mg total) by mouth 2 (two) times daily.   doxazosin (CARDURA) 2 MG tablet TAKE 1 TABLET BY MOUTH DAILY   rosuvastatin (CRESTOR) 40 MG tablet TAKE 1 TABLET BY MOUTH EVERY DAY   valsartan-hydrochlorothiazide (DIOVAN-HCT) 320-25 MG tablet Take 1 tablet by mouth daily.   Current Facility-Administered Medications for the 11/25/20 encounter (Office Visit) with Sueanne Margarita, MD  Medication   0.9 %  sodium chloride infusion     Allergies:   Amlodipine, Norvasc [amlodipine besylate], and Prednisone   Social History   Tobacco Use   Smoking status: Never   Smokeless tobacco: Never  Vaping Use   Vaping Use: Never used  Substance Use Topics   Alcohol use: Yes    Alcohol/week: 4.0 standard drinks    Types: 4 Glasses of wine per week   Drug use: No     Family Hx: The patient's family  history includes Breast cancer in his mother and sister; Colon polyps in his brother and mother; Diabetes in his brother; Hypertension in his brother; Kidney failure in his father. There is no history of Colon cancer, Stomach cancer, Rectal cancer, Esophageal cancer, or Liver cancer.  ROS:   Please see the history of present illness.     All other systems reviewed and are negative.   Labs/Other Tests and Data Reviewed:    Recent Labs: 11/13/2020: BUN 17; Creatinine, Ser 1.03; Potassium 4.2; Sodium 140   Recent Lipid Panel Lab Results  Component Value Date/Time   CHOL 158 03/04/2019 12:00 AM   TRIG 223 (H) 03/04/2019 12:00 AM   HDL 53 03/04/2019 12:00 AM   CHOLHDL 3.0 03/04/2019 12:00 AM   CHOLHDL 3.6 05/13/2015 08:44 AM   LDLCALC 69 03/04/2019 12:00 AM    Wt Readings from Last 3 Encounters:   11/25/20 271 lb (122.9 kg)  01/21/20 272 lb (123.4 kg)  01/08/20 272 lb (123.4 kg)     Objective:    Vital Signs:  BP 118/78   Pulse 73   Ht 5\' 11"  (1.803 m)   Wt 271 lb (122.9 kg)   SpO2 95%   BMI 37.80 kg/m    GEN: Well nourished, well developed in no acute distress HEENT: Normal NECK: No JVD; No carotid bruits LYMPHATICS: No lymphadenopathy CARDIAC:RRR, no murmurs, rubs, gallops RESPIRATORY:  Clear to auscultation without rales, wheezing or rhonchi  ABDOMEN: Soft, non-tender, non-distended MUSCULOSKELETAL:  No edema; No deformity  SKIN: Warm and dry NEUROLOGIC:  Alert and oriented x 3 PSYCHIATRIC:  Normal affect    EKG was performed in the office today and showed NSR with no ST changes  ASSESSMENT & PLAN:    1.  OSA - The patient is tolerating PAP therapy well without any problems. The PAP download performed by his DME was personally reviewed and interpreted by me today and showed an AHI of 1.5/hr on 12 cm H2O with 13% compliance in using more than 4 hours nightly.  The patient has been using and benefiting from PAP use and will continue to benefit from therapy.  -encouraged him to get a humidifier in his room to try to cut back on running out of water in his resrvoir -encouraged him to be more compliant   2.  Hypertension  -BP controlled on exam -Continue prescription drug management with Doxazosin 2mg  daily and Valsartan-HCT 320-25mg  daily>refilled  -I have personally reviewed and interpreted outside labs performed by patient's PCP which showed SCr 1.03 and K+ 4.2 in Sept 2022   3.  Obesity  -I have encouraged him to get into a routine exercise program and cut back on carbs and portions.   4.  Ascending aortic aneurysm -4.5cm by Chest CT in 2018 -Chest CTA 10/2020 stable at 4.2cm -BP controlled  5.  HLD -LDL goal < 70 -I have personally reviewed and interpreted outside labs performed by patient's PCP which showed ldl 60,HDL 42, alt 36 in May 2022  -continue  prescription drug management  with Crestor 40mg  daily >>refilled  6.  Coronary artery calcification -coronary Ca score was 102 on CT in 2021 -normal nuclear stress test 10/2019 -he has not had any anginal symptoms -continue statin   Medication Adjustments/Labs and Tests Ordered: Current medicines are reviewed at length with the patient today.  Concerns regarding medicines are outlined above.  Tests Ordered: Orders Placed This Encounter  Procedures   EKG 12-Lead    Medication Changes: No  orders of the defined types were placed in this encounter.   Disposition:  Follow up in 1 year(s)  Signed, Fransico Him, MD  11/25/2020 10:53 AM    [facility]

## 2020-11-25 NOTE — Patient Instructions (Signed)
Medication Instructions:  Your physician recommends that you continue on your current medications as directed. Please refer to the Current Medication list given to you today.  *If you need a refill on your cardiac medications before your next appointment, please call your pharmacy*  Follow-Up: At [facility], you and your health needs are our priority.  As part of our continuing mission to provide you with exceptional heart care, we have created designated Provider Care Teams.  These Care Teams include your primary Cardiologist (physician) and Advanced Practice Providers (APPs -  Physician Assistants and Nurse Practitioners) who all work together to provide you with the care you need, when you need it.  Your next appointment:   1 year(s)  The format for your next appointment:   In Person  Provider:   Traci Lahmann, MD  

## 2021-01-02 ENCOUNTER — Other Ambulatory Visit: Payer: Self-pay | Admitting: Cardiology

## 2021-09-08 ENCOUNTER — Other Ambulatory Visit: Payer: Self-pay | Admitting: Cardiology

## 2021-10-29 ENCOUNTER — Encounter (HOSPITAL_BASED_OUTPATIENT_CLINIC_OR_DEPARTMENT_OTHER): Payer: Self-pay | Admitting: Cardiology

## 2021-10-29 DIAGNOSIS — G4733 Obstructive sleep apnea (adult) (pediatric): Secondary | ICD-10-CM | POA: Diagnosis not present

## 2021-11-03 DIAGNOSIS — H2513 Age-related nuclear cataract, bilateral: Secondary | ICD-10-CM | POA: Diagnosis not present

## 2021-11-05 NOTE — Progress Notes (Deleted)
Office Visit    Patient Name: Patrick Gilmore Date of Encounter: 11/05/2021  Primary Care Provider:  Aretta Nip, MD Primary Cardiologist:  Patrick Him, MD Primary Electrophysiologist: None  Chief Complaint    Patrick Gilmore is a 66 y.o. male with PMH of OSA on CPAP, HLD, obesity, HTN, aortic aneurysm who presents today for 25-monthfollow-up.  Past Medical History    Past Medical History:  Diagnosis Date   [place]    [place]    417mby Chest CTA 10/2019   Coronary artery calcification seen on CAT scan    coronary Ca score 102 on 10/2019.  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  He was originally seen by Patrick Gilmore 05/2013 for complaint of dyspnea on exertion.  [facility] 10/2020 and showed diameter of 42 mm.  He underwent a Lexiscan Myoview in 2021 that was normal with no ischemia.  He was last seen by Patrick Gilmore  10/2020 for annual follow-up.  Since last being seen in the office patient reports***.  Patient denies chest pain, palpitations, dyspnea, PND, orthopnea, nausea, vomiting, dizziness, syncope, edema, weight gain, or early satiety.     ***Notes:  Home Medications    Current Outpatient Medications  Medication Sig Dispense Refill   aspirin EC 81 MG tablet Take 81 mg by mouth daily. Swallow whole.     carvedilol (COREG) 25 MG tablet Take 1 tablet (25 mg total) by mouth 2 (two) times daily. 180 tablet 3   doxazosin (CARDURA) 2 MG tablet TAKE 1 TABLET BY MOUTH DAILY 90 tablet 3   rosuvastatin (CRESTOR) 40 MG tablet Take 1 tablet (40 mg total) by mouth daily. 90 tablet 3   valsartan-hydrochlorothiazide (DIOVAN-HCT) 320-25 MG tablet TAKE 1 TABLET BY MOUTH EVERY DAY 90 tablet 0   Current Facility-Administered Medications  Medication Dose Route Frequency Provider Last Rate Last Admin   0.9 %  sodium chloride infusion  500 mL Intravenous Once Patrick Gilmore, StCarlota RaspberryMD         Review of Systems  Please see the history of present illness.    (+)*** (+)***  All other systems reviewed and are otherwise negative except as noted above.  Physical Exam    Wt Readings from Last 3 Encounters:  11/25/20 271 lb (122.9 kg)  01/21/20 272 lb (123.4 kg)  01/08/20 272 lb (123.4 kg)   CW:CBJSE were no vitals filed for this visit.,There is no height or weight on file to calculate BMI.  Constitutional:      Appearance: Healthy appearance. Not in distress.  Neck:     Vascular: JVD normal.  Pulmonary:     Effort: Pulmonary effort is normal.     Breath sounds: No wheezing. No rales. Diminished in the bases Cardiovascular:     Normal rate. Regular rhythm. Normal S1. Normal S2.      Murmurs: There is no murmur.  Edema:    Peripheral edema absent.  Abdominal:     Palpations: Abdomen is soft non tender. There is no hepatomegaly.  Skin:    General: Skin is warm and dry.  Neurological:     General: No  focal deficit present.     Mental Status: Alert and oriented to person, place and time.     Cranial Nerves: Cranial nerves are intact.  EKG/LABS/Other Studies Reviewed    ECG personally reviewed by me today - ***  Risk Assessment/Calculations:   {Does this patient have ATRIAL FIBRILLATION?:[phone]}        Lab Results  Component Value Date   HGB 15.6 01/07/2008   HCT 46.0 01/07/2008   Lab Results  Component Value Date   CREATININE 1.03 11/13/2020   BUN 17 11/13/2020   NA 140 11/13/2020   K 4.2 11/13/2020   CL 101 11/13/2020   CO2 23 11/13/2020   Lab Results  Component Value Date   ALT 45 (H) 04/05/2019   AST 24 04/05/2019   ALKPHOS 40 04/05/2019   BILITOT 0.5 04/05/2019   Lab Results  Component Value Date   CHOL 158 03/04/2019   HDL 53 03/04/2019   LDLCALC 69 03/04/2019   TRIG 223 (H) 03/04/2019   CHOLHDL 3.0 03/04/2019    Lab Results  Component Value Date   HGBA1C 6.3 12/21/2015    Assessment & Plan    1.  Hypertension  2.  Ascending aortic aneurysm  3.  Hyperlipidemia  4.  Obstructive sleep apnea      Disposition: Follow-up with Patrick Him, MD or APP in *** months {Are you ordering a CV Procedure (e.g. stress test, cath, DCCV, TEE, etc)?   Press F2        :[id number]}   Medication Adjustments/Labs and Tests Ordered: Current medicines are reviewed at length with the patient today.  Concerns regarding medicines are outlined above.   Signed, Patrick Gilmore, Patrick Nestle, NP 11/05/2021, 12:53 PM [place]

## 2021-11-09 ENCOUNTER — Ambulatory Visit: Payer: Medicare Other | Admitting: Nurse Practitioner

## 2021-11-11 DIAGNOSIS — I712 Thoracic aortic aneurysm, without rupture, unspecified: Secondary | ICD-10-CM | POA: Diagnosis not present

## 2021-11-11 DIAGNOSIS — R7303 Prediabetes: Secondary | ICD-10-CM | POA: Diagnosis not present

## 2021-11-11 DIAGNOSIS — E7800 Pure hypercholesterolemia, unspecified: Secondary | ICD-10-CM | POA: Diagnosis not present

## 2021-11-11 DIAGNOSIS — G629 Polyneuropathy, unspecified: Secondary | ICD-10-CM | POA: Diagnosis not present

## 2021-11-11 DIAGNOSIS — Z23 Encounter for immunization: Secondary | ICD-10-CM | POA: Diagnosis not present

## 2021-11-11 DIAGNOSIS — I10 Essential (primary) hypertension: Secondary | ICD-10-CM | POA: Diagnosis not present

## 2021-11-11 DIAGNOSIS — G4733 Obstructive sleep apnea (adult) (pediatric): Secondary | ICD-10-CM | POA: Diagnosis not present

## 2021-11-11 DIAGNOSIS — Z0000 Encounter for general adult medical examination without abnormal findings: Secondary | ICD-10-CM | POA: Diagnosis not present

## 2021-11-15 IMAGING — CT CT ANGIO CHEST
2 of 8 series · 17 of 46 positions shown · IV contrast (OMNIPAQUE 350)
Comparison: 11/20/2018

CLINICAL DATA: Ascending thoracic aortic aneurysm

EXAM:
CT ANGIOGRAPHY CHEST WITH CONTRAST
TECHNIQUE: Multidetector CT imaging of the chest was performed using the
standard protocol during bolus administration of intravenous
contrast. Multiplanar CT image reconstructions and MIPs were
obtained to evaluate the vascular anatomy.
CONTRAST:  100mL OMNIPAQUE IOHEXOL 350 MG/ML SOLN

[Series 4: aorta 3.0 bf37 2 · axial · 0.78mm/px · z∈[-350,-78]mm · 14 of 105 slices shown]
[im 7/105  lung]
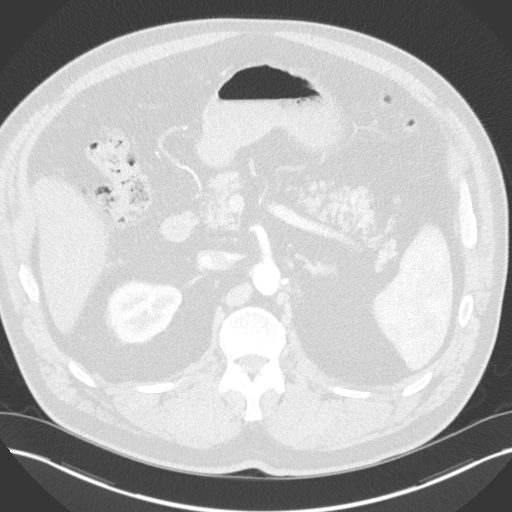
[im 14/105  soft-tissue]
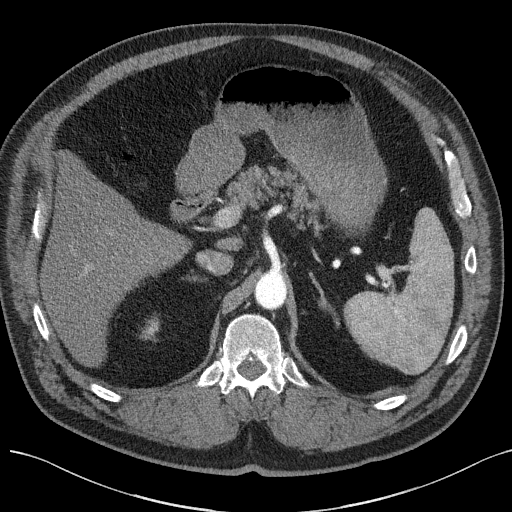
[im 21/105  lung]
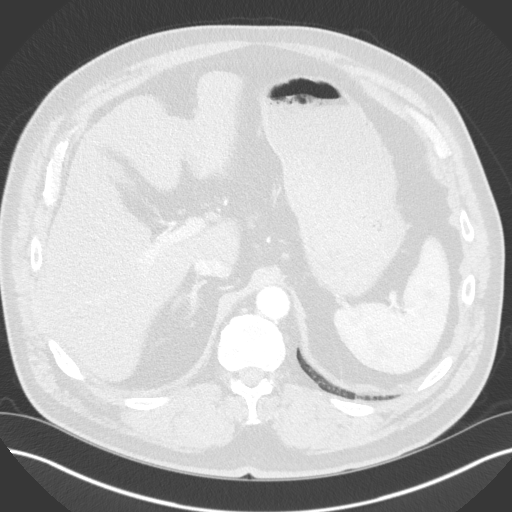
[im 28/105  soft-tissue]
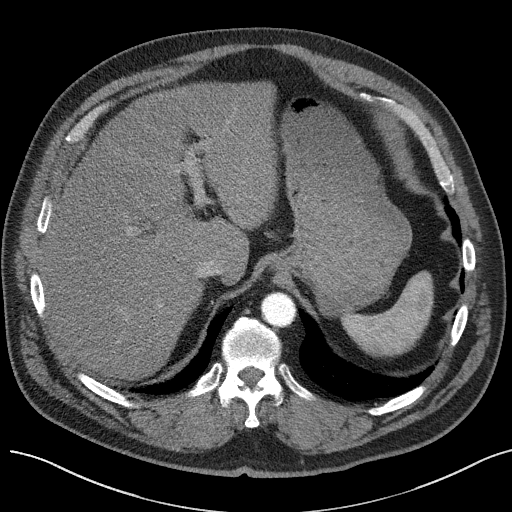
[im 35/105  lung]
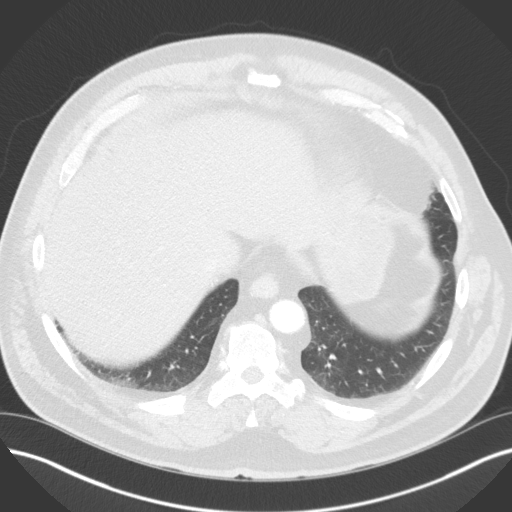
[im 42/105  soft-tissue]
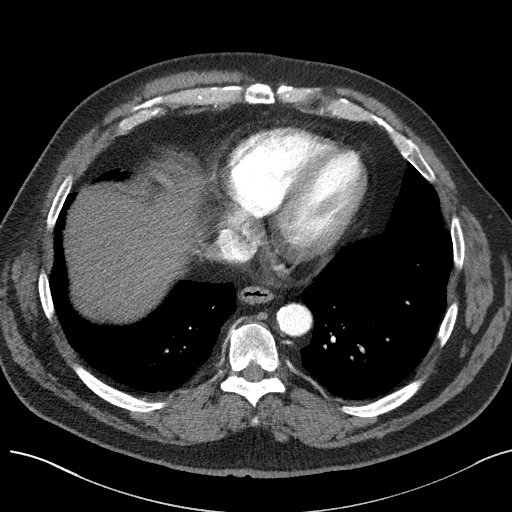
[im 49/105  lung]
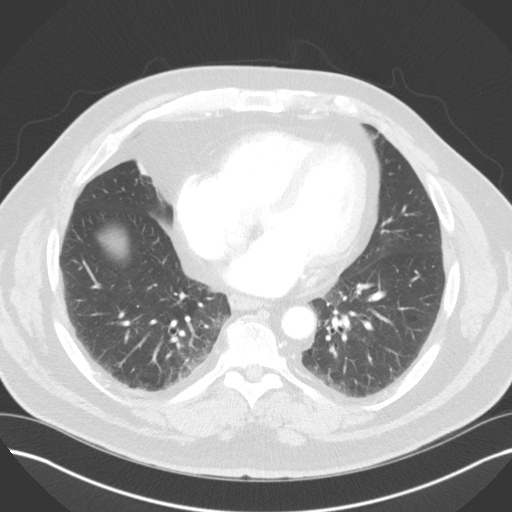
[im 56/105  soft-tissue]
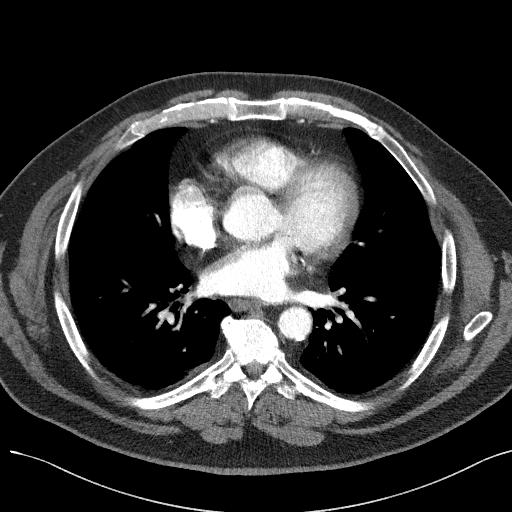
[im 63/105  lung]
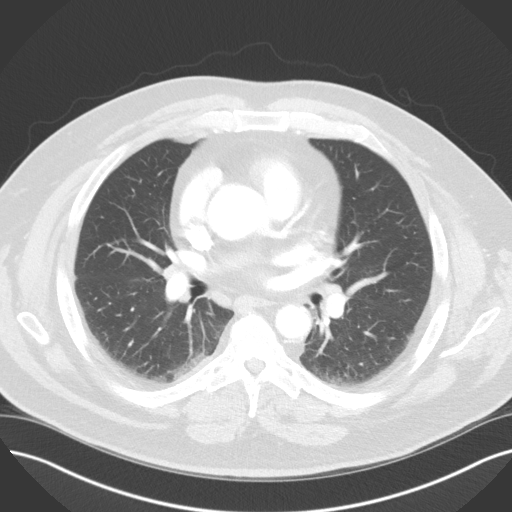
[im 70/105  soft-tissue]
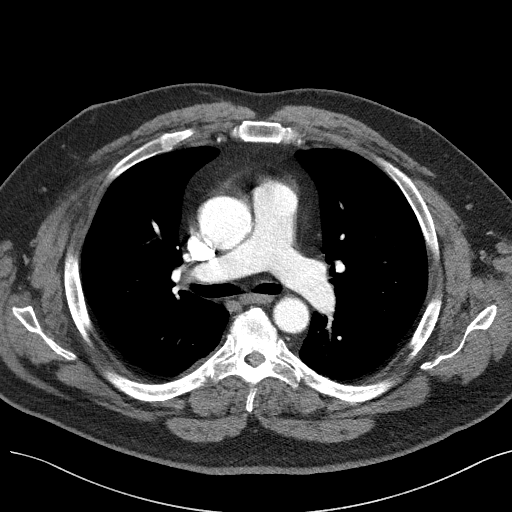
[im 77/105  lung]
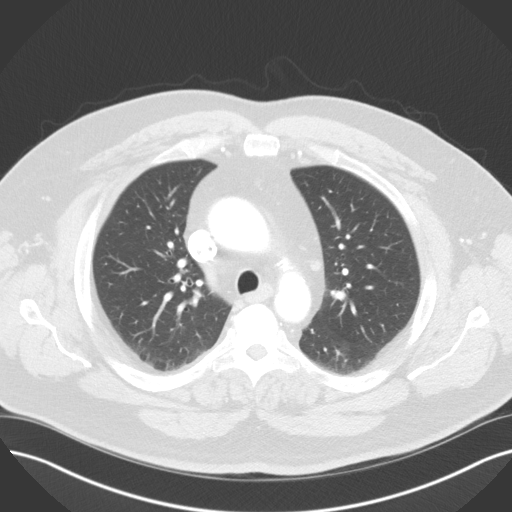
[im 84/105  soft-tissue]
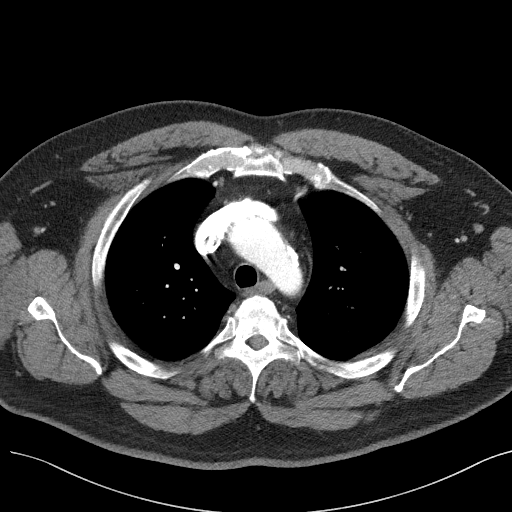
[im 91/105  lung]
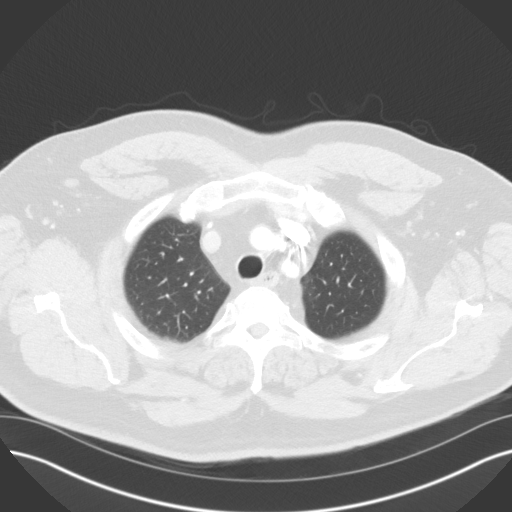
[im 98/105  soft-tissue]
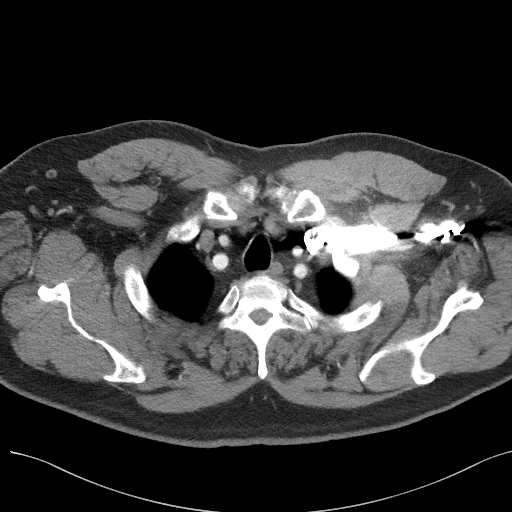

[Series 7: coronals · coronal · 0.64mm/px · 3 of 151 slices shown]
[im 38/151  soft-tissue]
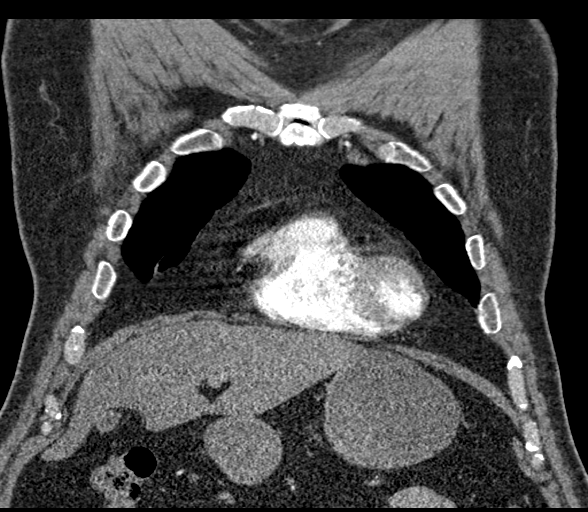
[im 76/151  soft-tissue]
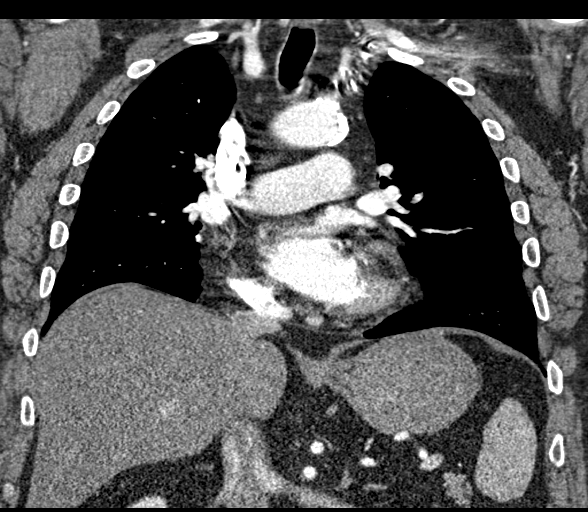
[im 113/151  soft-tissue]
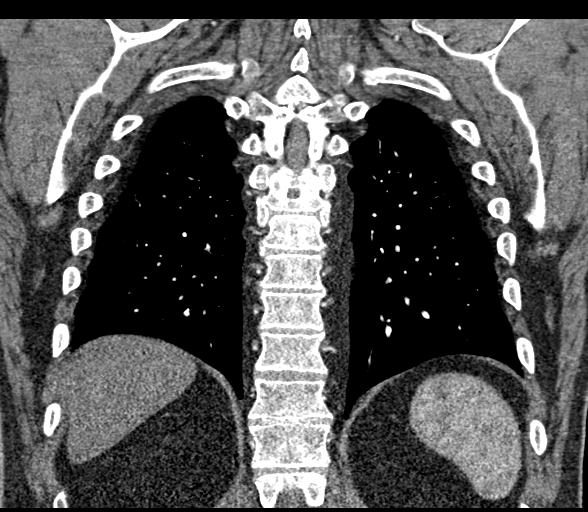

[17 of 46 positions shown; findings below may reference images not displayed]

FINDINGS: Cardiovascular: Similar minor fusiform aneurysmal dilatation of the
ascending thoracic aorta, maximal diameter 42 mm, previously 43 mm.
Remainder of the aorta is normal in caliber. Minor atherosclerotic
change. Bovine arch anatomy. Arch vessels remain patent. No
dissection or mediastinal hemorrhage. Central pulmonary arteries are
normal in caliber and patent. No filling defects or acute pulmonary
embolus.

Mild cardiac enlargement.  Native coronary atherosclerosis noted.

Central venous structures are patent.  No Akinmolayan process.

Mediastinum/Nodes: No enlarged mediastinal, hilar, or axillary lymph
nodes. Thyroid gland, trachea, and esophagus demonstrate no
significant findings.

Lungs/Pleura: Minor bibasilar dependent lower lobe hypoventilatory
changes. No acute airspace process, collapse or consolidation. No
interstitial change or edema. Chronic right middle lobe bandlike
scarring.

No pleural abnormality, effusion, or pneumothorax.

Upper Abdomen: Hepatic steatosis again noted. Gallbladder collapsed.
No acute upper abdominal finding. Abdominal atherosclerosis present.

Musculoskeletal: Degenerative changes of the spine. No chest wall
soft tissue abnormality or asymmetry.

Review of the MIP images confirms the above findings.
IMPRESSION: Stable aneurysmal dilatation of the ascending thoracic aorta, 42 mm
diameter.

Recommend annual imaging followup by CTA or MRA. This recommendation
follows 3878 ACCF/AHA/AATS/ACR/ASA/SCA/FAMILIA/VADIVEL/JORGE FERNANDES/FERIENHAUS Guidelines
for the Diagnosis and Management of Patients with Thoracic Aortic
Disease. Circulation. 3878; 121: [id number]. Aortic aneurysm NOS
(VQ1I9-UTJ.W)

No other acute intrathoracic finding

No significant acute pulmonary embolus

Native coronary atherosclerosis

Hepatic steatosis

Aortic Atherosclerosis (VQ1I9-WUD.D).

Aortic aneurysm NOS (VQ1I9-UTJ.W).

## 2021-12-09 DIAGNOSIS — G4733 Obstructive sleep apnea (adult) (pediatric): Secondary | ICD-10-CM | POA: Diagnosis not present

## 2021-12-17 DIAGNOSIS — E7800 Pure hypercholesterolemia, unspecified: Secondary | ICD-10-CM | POA: Diagnosis not present

## 2021-12-22 DIAGNOSIS — G4733 Obstructive sleep apnea (adult) (pediatric): Secondary | ICD-10-CM | POA: Diagnosis not present

## 2021-12-25 ENCOUNTER — Other Ambulatory Visit: Payer: Self-pay | Admitting: Cardiology

## 2022-01-15 ENCOUNTER — Other Ambulatory Visit: Payer: Self-pay | Admitting: Cardiology

## 2022-01-18 ENCOUNTER — Other Ambulatory Visit: Payer: Self-pay | Admitting: Cardiology

## 2022-01-18 DIAGNOSIS — G4733 Obstructive sleep apnea (adult) (pediatric): Secondary | ICD-10-CM | POA: Diagnosis not present

## 2022-01-22 ENCOUNTER — Other Ambulatory Visit: Payer: Self-pay | Admitting: Cardiology

## 2022-01-24 DIAGNOSIS — J0110 Acute frontal sinusitis, unspecified: Secondary | ICD-10-CM | POA: Diagnosis not present

## 2022-02-17 DIAGNOSIS — G4733 Obstructive sleep apnea (adult) (pediatric): Secondary | ICD-10-CM | POA: Diagnosis not present

## 2022-03-09 ENCOUNTER — Ambulatory Visit: Payer: Medicare Other | Attending: Cardiology | Admitting: Cardiology

## 2022-03-09 ENCOUNTER — Encounter: Payer: Self-pay | Admitting: Cardiology

## 2022-03-09 VITALS — BP 110/68 | HR 78 | Ht 70.5 in | Wt 266.4 lb

## 2022-03-09 DIAGNOSIS — I77810 Thoracic aortic ectasia: Secondary | ICD-10-CM

## 2022-03-09 DIAGNOSIS — R931 Abnormal findings on diagnostic imaging of heart and coronary circulation: Secondary | ICD-10-CM

## 2022-03-09 DIAGNOSIS — I10 Essential (primary) hypertension: Secondary | ICD-10-CM | POA: Diagnosis not present

## 2022-03-09 DIAGNOSIS — E7800 Pure hypercholesterolemia, unspecified: Secondary | ICD-10-CM | POA: Diagnosis not present

## 2022-03-09 DIAGNOSIS — G4733 Obstructive sleep apnea (adult) (pediatric): Secondary | ICD-10-CM | POA: Diagnosis not present

## 2022-03-09 DIAGNOSIS — I712 Thoracic aortic aneurysm, without rupture, unspecified: Secondary | ICD-10-CM | POA: Diagnosis not present

## 2022-03-09 DIAGNOSIS — E669 Obesity, unspecified: Secondary | ICD-10-CM | POA: Diagnosis not present

## 2022-03-09 MED ORDER — ROSUVASTATIN CALCIUM 40 MG PO TABS: 40.0000 mg | ORAL_TABLET | Freq: Every day | ORAL | 3 refills | Status: DC

## 2022-03-09 MED ORDER — DIAZEPAM 5 MG PO TABS: 5.0000 mg | ORAL_TABLET | Freq: Once | ORAL | 0 refills | Status: AC

## 2022-03-09 MED ORDER — CARVEDILOL 25 MG PO TABS: 25.0000 mg | ORAL_TABLET | Freq: Two times a day (BID) | ORAL | 3 refills | Status: DC

## 2022-03-09 NOTE — Addendum Note (Signed)
Addended by: Molli Barrows on: 03/09/2022 11:24 AM   Modules accepted: Orders

## 2022-03-09 NOTE — Progress Notes (Signed)
Date:  03/09/2022   ID:  Patrick Gilmore, DOB 12-23-1955, MRN [id number]  PCP:  Aretta Nip, MD  Cardiologist:  Fransico Him, MD Electrophysiologist:  None   Chief Complaint:  HTN  History of Present Illness:    Patrick Gilmore is a 67 y.o. male with a hx of HTN, obesity, dyslipidemia and OSA on CPAP. He has a hx of moderate ascending aortic aneurysm by CT in 2018.  This also showed some calcifications in the coronary arteries.  He has not had any chest pain although he says when he does not use his PAP device his chest wall  Pain but has not chest pressure or discomfort with exertion and no DOE.   He has been having problems with dry mouth and not tolerating the FFM as much as he would like.  He says that he runs out of water at night.  He feels the pressure is adequate. When he uses his CPAP he feels rested in the am and has no significant daytime sleepiness.  When he does not use CPAP he will have some chest tightness.   He is here today for followup and is doing well.  He denies any chest pain or pressure, SOB, DOE, PND, orthopnea, LE edema (except traveling long distances), dizziness, palpitations or syncope. He is compliant with his meds and is tolerating meds with no SE.     Prior CV studies:   The following studies were reviewed today:  PAP compliance download  Past Medical History:  Diagnosis Date   Aortic atherosclerosis ([place])    Ascending aortic aneurysm (Patrick Gilmore)    62m by Chest CTA 10/2019   Coronary artery calcification seen on CAT scan    coronary Ca score 102 on 10/2019.  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 Swallow whole.   doxazosin (CARDURA) 2 MG tablet Take 1 tablet (2 mg total) by mouth daily. Please keep upcoming appt.with Dr. [place]adford Pax[place] to receive future refills. Thank You.   gabapentin (NEURONTIN) 100 MG capsule Take 100 mg by mouth daily.   valsartan-hydrochlorothiazide (DIOVAN-HCT) 320-25 MG tablet TAKE ONE TABLET BY MOUTH DAILY   [DISCONTINUED] carvedilol (COREG) 25 MG tablet Take 1 tablet (25 mg total) by mouth 2 (two) times daily. Please keep upcoming appt.[place] Pax[place] to receive future refills. Thank You.   [DISCONTINUED] rosuvastatin (CRESTOR) 40 MG tablet Take 1 tablet (40 mg total) by mouth daily. Please keep scheduled appointment for future refills. Thank you.   Current Facility-Administered Medications for the 03/09/22 encounter (Office Visit) with TSueanne Margarita MD  Medication   0.9 %  sodium chloride infusion     Allergies:   Amlodipine, Norvasc [amlodipine besylate], and Prednisone   Social History   Tobacco Use  Smoking status: Never   Smokeless tobacco: Never  Vaping Use   Vaping Use: Never used  Substance Use Topics   Alcohol use: Yes    Alcohol/week: 4.0 standard drinks of alcohol    Types: 4 Glasses of wine per week   Drug use: No     Family Hx: The patient's family history includes Breast cancer in his mother and sister; Colon polyps in his brother and mother; Diabetes in his brother; Hypertension in his brother; Kidney failure in his father. There is no history of Colon cancer, Stomach cancer, Rectal cancer, Esophageal cancer, or Liver cancer.  ROS:   Please see the history of present illness.     All other systems reviewed and are negative.   Labs/Other Tests and Data Reviewed:    Recent  Labs: No results found for requested labs within last 365 days.   Recent Lipid Panel Lab Results  Component Value Date/Time   CHOL 158 03/04/2019 12:00 AM   TRIG 223 (H) 03/04/2019 12:00 AM   HDL 53 03/04/2019 12:00 AM   CHOLHDL 3.0 03/04/2019 12:00 AM   CHOLHDL 3.6 05/13/2015 08:44 AM   LDLCALC 69 03/04/2019 12:00 AM    Wt Readings from Last 3 Encounters:  03/09/22 266 lb 6.4 oz (120.8 kg)  11/25/20 271 lb (122.9 kg)  01/21/20 272 lb (123.4 kg)     Objective:    Vital Signs:  BP 110/68   Pulse 78   Ht 5' 10.5" (1.791 m)   Wt 266 lb 6.4 oz (120.8 kg)   SpO2 97%   BMI 37.68 kg/m    GEN: Well nourished, well developed in no acute distress HEENT: Normal NECK: No JVD; No carotid bruits LYMPHATICS: No lymphadenopathy CARDIAC:RRR, no murmurs, rubs, gallops RESPIRATORY:  Clear to auscultation without rales, wheezing or rhonchi  ABDOMEN: Soft, non-tender, non-distended MUSCULOSKELETAL:  No edema; No deformity  SKIN: Warm and dry NEUROLOGIC:  Alert and oriented x 3 PSYCHIATRIC:  Normal affect    EKG was not performed in the office today   ASSESSMENT & PLAN:    1.  OSA - The patient is tolerating PAP therapy well without any problems. The patient has been using and benefiting from PAP use and will continue to benefit from therapy.  -he is now being followed by [facility]   2.  Hypertension  -BP controlled on exam today  -Continue prescription drug managed with carvedilol 25 mg twice daily, doxazosin 2 mg daily and valsartan HCT 320/25 mg daily with as needed refills -I have personally reviewed and interpreted outside labs performed by patient's PCP which showed serum creatinine 1.11 and potassium 3.9 on 11/11/2021   3.  Obesity  -I have encouraged him to get into a routine exercise program and cut back on carbs and portions.   4.  Ascending aortic dilatation -4.5cm by Chest CT in 2018 -Chest CTA 10/2020 stable at 4.2cm -Will get chest MRI/MRA to assess stability  of ascending aortic dilatation -BP is well-controlled  5.  HLD -LDL goal < 70 -I have personally reviewed and interpreted outside labs performed by patient's PCP which showed LDL 59 and HDL 45 on 12/17/2021 -Continue prescription drug management with Crestor 40 mg daily with as needed refills  6.  Coronary artery calcification -coronary Ca score was 102 on CT in 2021 -normal nuclear stress test 10/2019 -He denies any anginal symptoms -Continue prescription drug management with aspirin 81 mg daily and rosuvastatin 40 mg daily with as needed refills  Medication Adjustments/Labs and Tests Ordered: Current medicines are reviewed at length with the patient today.  Concerns regarding medicines are outlined above.  Tests Ordered: No orders of the defined types were placed in this encounter.   Medication Changes: Meds ordered this encounter  Medications   carvedilol (COREG) 25 MG tablet    Sig: Take 1 tablet (25 mg total) by mouth 2 (two) times daily. Please keep upcoming appt.in [place] Dr. Radford Pax in order to receive future refills. Thank You.    Dispense:  180 tablet    Refill:  3    Please keep upcoming appt.in [place] Dr. Radford Pax in order to receive future refills. Thank You.   rosuvastatin (CRESTOR) 40 MG tablet    Sig: Take 1 tablet (40 mg total) by mouth daily. Please keep scheduled appointment for future refills. Thank you.    Dispense:  90 tablet    Refill:  3    Appointment needed for future refills. Thank you.     Disposition:  Follow up in 1 year(s)  Signed, Fransico Him, MD  03/09/2022 11:09 AM    [facility]

## 2022-03-09 NOTE — Patient Instructions (Signed)
Medication Instructions:  Your physician recommends that you continue on your current medications as directed. Please refer to the Current Medication list given to you today.  *If you need a refill on your cardiac medications before your next appointment, please call your pharmacy*  Lab Work: NONE  Testing/Procedures: Your physician has requested that you have a MR angio of the chest performed. You will need a ride to and from the procedure due to the anti-anxiety medication prescribed to you.  Follow-Up: At [facility], you and your health needs are our priority.  As part of our continuing mission to provide you with exceptional heart care, we have created designated Provider Care Teams.  These Care Teams include your primary Cardiologist (physician) and Advanced Practice Providers (APPs -  Physician Assistants and Nurse Practitioners) who all work together to provide you with the care you need, when you need it.  Your next appointment:   1 year(s)  The format for your next appointment:   In Person  Provider:   Fransico Him, MD    Important Information About Sugar

## 2022-03-18 ENCOUNTER — Encounter: Payer: Self-pay | Admitting: Cardiology

## 2022-03-20 DIAGNOSIS — G4733 Obstructive sleep apnea (adult) (pediatric): Secondary | ICD-10-CM | POA: Diagnosis not present

## 2022-03-21 ENCOUNTER — Telehealth: Payer: Self-pay

## 2022-03-21 MED ORDER — DIAZEPAM 5 MG PO TABS: 5.0000 mg | ORAL_TABLET | Freq: Once | ORAL | 0 refills | Status: AC

## 2022-03-21 NOTE — Telephone Encounter (Signed)
Called to advise patient that a paper script for one 5 mg tablet of valium will be at front desk for him. Advised that he would need to take 30 minutes prior to cardiac MRI. Patient verbalized understanding.

## 2022-03-21 NOTE — Telephone Encounter (Signed)
Spoke to patient to advise that his script for Valium 5 mg tablet x 1 dose prior to cardiac MRI will be available at the front desk for pick up.

## 2022-03-24 ENCOUNTER — Ambulatory Visit
Admission: RE | Admit: 2022-03-24 | Discharge: 2022-03-24 | Disposition: A | Discharge status: Home / Self Care | Payer: Medicare Other | Source: Ambulatory Visit | Attending: Cardiology | Admitting: Cardiology

## 2022-03-24 DIAGNOSIS — I712 Thoracic aortic aneurysm, without rupture, unspecified: Secondary | ICD-10-CM

## 2022-03-24 MED ORDER — GADOPICLENOL 0.5 MMOL/ML IV SOLN
10.0000 mL | Freq: Once | INTRAVENOUS | Status: AC | PRN
  Administered 2022-03-24: 10 mL via INTRAVENOUS

## 2022-03-26 ENCOUNTER — Encounter: Payer: Self-pay | Admitting: Cardiology

## 2022-04-14 ENCOUNTER — Other Ambulatory Visit: Payer: Self-pay | Admitting: Cardiology

## 2022-04-20 DIAGNOSIS — G4733 Obstructive sleep apnea (adult) (pediatric): Secondary | ICD-10-CM | POA: Diagnosis not present

## 2022-04-21 ENCOUNTER — Other Ambulatory Visit: Payer: Self-pay | Admitting: *Deleted

## 2022-04-21 DIAGNOSIS — I77810 Thoracic aortic ectasia: Secondary | ICD-10-CM

## 2022-04-21 NOTE — Progress Notes (Signed)
Order for Chest MRA/MRI in one year per Dr. Radford Pax.

## 2022-04-27 ENCOUNTER — Encounter: Payer: Self-pay | Admitting: Cardiology

## 2022-04-27 ENCOUNTER — Telehealth: Payer: Self-pay

## 2022-04-27 NOTE — Telephone Encounter (Signed)
Called patient to review results of cardiac MRI (stable mildly dilated aorta), patient verbalizes understanding.

## 2022-05-19 DIAGNOSIS — G4733 Obstructive sleep apnea (adult) (pediatric): Secondary | ICD-10-CM | POA: Diagnosis not present

## 2022-05-31 DIAGNOSIS — E7800 Pure hypercholesterolemia, unspecified: Secondary | ICD-10-CM | POA: Diagnosis not present

## 2022-05-31 DIAGNOSIS — R197 Diarrhea, unspecified: Secondary | ICD-10-CM | POA: Diagnosis not present

## 2022-06-19 DIAGNOSIS — G4733 Obstructive sleep apnea (adult) (pediatric): Secondary | ICD-10-CM | POA: Diagnosis not present

## 2022-06-28 ENCOUNTER — Other Ambulatory Visit: Payer: Self-pay | Admitting: Cardiology

## 2022-07-01 DIAGNOSIS — L814 Other melanin hyperpigmentation: Secondary | ICD-10-CM | POA: Diagnosis not present

## 2022-07-01 DIAGNOSIS — D225 Melanocytic nevi of trunk: Secondary | ICD-10-CM | POA: Diagnosis not present

## 2022-07-01 DIAGNOSIS — L821 Other seborrheic keratosis: Secondary | ICD-10-CM | POA: Diagnosis not present

## 2022-07-08 DIAGNOSIS — G4733 Obstructive sleep apnea (adult) (pediatric): Secondary | ICD-10-CM | POA: Diagnosis not present

## 2022-07-08 DIAGNOSIS — I712 Thoracic aortic aneurysm, without rupture, unspecified: Secondary | ICD-10-CM | POA: Diagnosis not present

## 2022-07-08 DIAGNOSIS — G5710 Meralgia paresthetica, unspecified lower limb: Secondary | ICD-10-CM | POA: Diagnosis not present

## 2022-07-08 DIAGNOSIS — G629 Polyneuropathy, unspecified: Secondary | ICD-10-CM | POA: Diagnosis not present

## 2022-07-08 DIAGNOSIS — R197 Diarrhea, unspecified: Secondary | ICD-10-CM | POA: Diagnosis not present

## 2022-07-19 DIAGNOSIS — G4733 Obstructive sleep apnea (adult) (pediatric): Secondary | ICD-10-CM | POA: Diagnosis not present

## 2022-08-19 DIAGNOSIS — G4733 Obstructive sleep apnea (adult) (pediatric): Secondary | ICD-10-CM | POA: Diagnosis not present

## 2022-09-18 DIAGNOSIS — G4733 Obstructive sleep apnea (adult) (pediatric): Secondary | ICD-10-CM | POA: Diagnosis not present

## 2022-10-03 DIAGNOSIS — G4733 Obstructive sleep apnea (adult) (pediatric): Secondary | ICD-10-CM | POA: Diagnosis not present

## 2022-10-19 DIAGNOSIS — G4733 Obstructive sleep apnea (adult) (pediatric): Secondary | ICD-10-CM | POA: Diagnosis not present

## 2022-11-15 DIAGNOSIS — E7800 Pure hypercholesterolemia, unspecified: Secondary | ICD-10-CM | POA: Diagnosis not present

## 2022-11-15 DIAGNOSIS — R748 Abnormal levels of other serum enzymes: Secondary | ICD-10-CM | POA: Diagnosis not present

## 2022-11-17 DIAGNOSIS — G629 Polyneuropathy, unspecified: Secondary | ICD-10-CM | POA: Diagnosis not present

## 2022-11-17 DIAGNOSIS — R748 Abnormal levels of other serum enzymes: Secondary | ICD-10-CM | POA: Diagnosis not present

## 2022-11-17 DIAGNOSIS — I10 Essential (primary) hypertension: Secondary | ICD-10-CM | POA: Diagnosis not present

## 2022-11-17 DIAGNOSIS — Z0000 Encounter for general adult medical examination without abnormal findings: Secondary | ICD-10-CM | POA: Diagnosis not present

## 2022-11-17 DIAGNOSIS — E7800 Pure hypercholesterolemia, unspecified: Secondary | ICD-10-CM | POA: Diagnosis not present

## 2022-12-24 ENCOUNTER — Other Ambulatory Visit: Payer: Self-pay | Admitting: Cardiology

## 2023-03-09 ENCOUNTER — Other Ambulatory Visit: Payer: Self-pay | Admitting: Cardiology

## 2023-03-15 ENCOUNTER — Ambulatory Visit: Payer: Medicare Other | Attending: Cardiology | Admitting: Cardiology

## 2023-03-15 ENCOUNTER — Encounter: Payer: Self-pay | Admitting: Cardiology

## 2023-03-15 VITALS — BP 118/64 | HR 82 | Ht 70.5 in | Wt 270.0 lb

## 2023-03-15 DIAGNOSIS — R931 Abnormal findings on diagnostic imaging of heart and coronary circulation: Secondary | ICD-10-CM

## 2023-03-15 DIAGNOSIS — E7800 Pure hypercholesterolemia, unspecified: Secondary | ICD-10-CM

## 2023-03-15 DIAGNOSIS — I77810 Thoracic aortic ectasia: Secondary | ICD-10-CM | POA: Diagnosis not present

## 2023-03-15 DIAGNOSIS — Z79899 Other long term (current) drug therapy: Secondary | ICD-10-CM

## 2023-03-15 DIAGNOSIS — G4733 Obstructive sleep apnea (adult) (pediatric): Secondary | ICD-10-CM

## 2023-03-15 DIAGNOSIS — I10 Essential (primary) hypertension: Secondary | ICD-10-CM | POA: Diagnosis not present

## 2023-03-15 NOTE — Progress Notes (Signed)
 Date:  03/15/2023   ID:  MAVRIC SLAVEY, DOB 1955/04/03, MRN [id number]  PCP:  Annamarie Kid, MD  Cardiologist:  Gaylyn Keas, MD Electrophysiologist:  None   Chief Complaint:  HTN  History of Present Illness:    Patrick Gilmore is a 68 y.o. male with a hx of HTN, obesity, dyslipidemia and OSA on CPAP. He has a hx of moderate ascending aortic aneurysm by CT in 2018.  This also showed some calcifications in the coronary arteries.  He has not had any chest pain although he says when he does not use his PAP device his chest wall  Pain but has not chest pressure or discomfort with exertion and no DOE.   He is here today for followup and is doing well.  He denies any chest pain or pressure, SOB, DOE, PND, orthopnea, LE edema,  palpitations or syncope. Occasionally he will get dizzy when he stands up too fast.  He also has LE neuropathy. He is compliant with his meds and is tolerating meds with no SE.      Prior CV studies:   The following studies were reviewed today: Chest MRI/MRA  Past Medical History:  Diagnosis Date   Aortic atherosclerosis ([facility])    Ascending aorta dilatation ([facility])    43 mm by chest MRI 02/2022   Coronary artery calcification seen on CAT scan    coronary Ca score 102 on 10/2019.  Negative Lexiscan  myoview  10/2019   H/O cardiovascular stress test 2007   EF 62%, decrease anterioseptal egion uptake on rest images only   Hyperlipidemia    Hypertension    Medically noncompliant    Obesity    OSA (obstructive sleep apnea)    Could not tolerate CPAP now on oral device followed by Dr Abel Abelson   Peripheral neuropathy 09/2011   Sleep apnea    Spastic colon    Xiphoid prominence 09/2011   Past Surgical History:  Procedure Laterality Date   COLONOSCOPY  11/28/2008   Gessner-normal exam   SHOULDER SURGERY  1978     Current Meds  Medication Sig   aspirin EC 81 MG tablet Take 81 mg by mouth daily. Swallow whole.   carvedilol  (COREG ) 25 MG tablet Take 1 tablet (25 mg  total) by mouth 2 (two) times daily. Please keep upcoming appt.in Jan.with Dr. Micael Adas in order to receive future refills. Thank You.   doxazosin  (CARDURA ) 2 MG tablet TAKE 1 TABLET BY MOUTH DAILY   rosuvastatin  (CRESTOR ) 40 MG tablet Take 1 tablet (40 mg total) by mouth daily. Please keep scheduled appointment for future refills. Thank you.   valsartan -hydrochlorothiazide  (DIOVAN -HCT) 320-25 MG tablet TAKE 1 TABLET BY MOUTH DAILY   Current Facility-Administered Medications for the 03/15/23 encounter (Office Visit) with Jacqueline Matsu, MD  Medication   0.9 %  sodium chloride  infusion     Allergies:   Amlodipine, Norvasc [amlodipine besylate], and Prednisone    Social History   Tobacco Use   Smoking status: Never   Smokeless tobacco: Never  Vaping Use   Vaping status: Never Used  Substance Use Topics   Alcohol use: Yes    Alcohol/week: 4.0 standard drinks of alcohol    Types: 4 Glasses of wine per week   Drug use: No     Family Hx: The patient's family history includes Breast cancer in his mother and sister; Colon polyps in his brother and mother; Diabetes in his brother; Hypertension in his brother; Kidney failure in his father.  There is no history of Colon cancer, Stomach cancer, Rectal cancer, Esophageal cancer, or Liver cancer.  ROS:   Please see the history of present illness.     All other systems reviewed and are negative.   Labs/Other Tests and Data Reviewed:    Recent Labs: No results found for requested labs within last 365 days.   Recent Lipid Panel Lab Results  Component Value Date/Time   CHOL 158 03/04/2019 12:00 AM   TRIG 223 (H) 03/04/2019 12:00 AM   HDL 53 03/04/2019 12:00 AM   CHOLHDL 3.0 03/04/2019 12:00 AM   CHOLHDL 3.6 05/13/2015 08:44 AM   LDLCALC 69 03/04/2019 12:00 AM    Wt Readings from Last 3 Encounters:  03/15/23 270 lb (122.5 kg)  03/09/22 266 lb 6.4 oz (120.8 kg)  11/25/20 271 lb (122.9 kg)     Objective:    Vital Signs:  BP 118/64    Pulse 82   Ht 5' 10.5" (1.791 m)   Wt 270 lb (122.5 kg)   SpO2 97%   BMI 38.19 kg/m    GEN: Well nourished, well developed in no acute distress HEENT: Normal NECK: No JVD; No carotid bruits LYMPHATICS: No lymphadenopathy CARDIAC:RRR, no murmurs, rubs, gallops RESPIRATORY:  Clear to auscultation without rales, wheezing or rhonchi  ABDOMEN: Soft, non-tender, non-distended MUSCULOSKELETAL:  No edema; No deformity  SKIN: Warm and dry NEUROLOGIC:  Alert and oriented x 3 PSYCHIATRIC:  Normal affect  ASSESSMENT & PLAN:    OSA  -[facility]   Hypertension  -BP well controlled on exam today -he has been having some dizziness and BP is in the 110's.  Will stop doxazosin  and see if BP improves. -continue prescription drug management with Carvedilol  25mg  BID, Valsartan  HCT 320-25mg  daily with PRN refills -I have personally reviewed and interpreted outside labs performed by patient's PCP which showed SCr 0.94 and K+ 3.8 on 02/15/23 -I have asked him to check his BP twice daily for a week and call with results.    Ascending aortic dilatation -4.5cm by Chest CT in 2018 -Chest CTA 10/2020 stable at 4.2cm -Chest MRI/MRA 4.3cm on 02/2022 -repeat Chest MRI/MRA -BP remains controlled  HLD -LDL goal < 70 -Check FLP and ALT -continue prescription drug management with Crestor  40mg  daily with PRN refills  Coronary artery calcification -coronary Ca score was 102 on CT in 2021 -normal nuclear stress test 10/2019 -denies any anginal symptoms -continue ASA 81mg  daily and statin  Morbid Obesity -with his obesity and OSA the FDA has approved Zepbound  which I think he would benefit from  -will refer to Pharm D to get on weight loss injection  Followup with me in 1 year  Medication Adjustments/Labs and Tests Ordered: Current medicines are reviewed at length with the patient today.  Concerns regarding medicines are outlined above.  Tests Ordered: No orders of the defined types were  placed in this encounter.   Medication Changes: No orders of the defined types were placed in this encounter.    Disposition:  Follow up in 1 year(s)  Signed, Gaylyn Keas, MD  03/15/2023 11:23 AM     [facility]

## 2023-03-15 NOTE — Patient Instructions (Addendum)
 Medication Instructions:  Please STOP your doxazosin  for 1 week. Check your BP every at lunch or dinner for one week. Then call our office and have our operators take down your readings. Or, you can submit those readings over MyChart.   *If you need a refill on your cardiac medications before your next appointment, please call your pharmacy*   Lab Work: Please complete a FASTING lipid panel and an ALT this week at any [facility].    If you have labs (blood work) drawn today and your tests are completely normal, you will receive your results only by: MyChart Message (if you have MyChart) OR A paper copy in the mail If you have any lab test that is abnormal or we need to change your treatment, we will call you to review the results.   Testing/Procedures:   You are scheduled for Cardiac MRI at the location below.   [facility] [address] [place], [place] [address] [phone] Please take advantage of the free valet parking available at the [facility] and [employer] (Entrance C).  Proceed to the [facility] Radiology Department (First Floor) for check-in.  .  Magnetic resonance imaging (MRI) is a painless test that produces images of the inside of the body without using Xrays.  During an MRI, strong magnets and radio waves work together in a Data processing manager to form detailed images.   MRI images may provide more details about a medical condition than X-rays, CT scans, and ultrasounds can provide.  You may be given earphones to listen for instructions.  You may eat a light breakfast and take medications as ordered with the exception of furosemide, hydrochlorothiazide , chlorthalidone or spironolactone  (or any other fluid pill). If you are undergoing a stress MRI, please avoid stimulants for 12 hr prior to test. (I.e. Caffeine, nicotine, chocolate, or antihistamine medications)  An IV will be inserted into one of your veins. Contrast material will be injected into your  IV. It will leave your body through your urine within a day. You may be told to drink plenty of fluids to help flush the contrast material out of your system.  You will be asked to remove all metal, including: Watch, jewelry, and other metal objects including hearing aids, hair pieces and dentures. Also wearable glucose monitoring systems (ie. Freestyle Libre and Omnipods) (Braces and fillings normally are not a problem.)   TEST WILL TAKE APPROXIMATELY 1 HOUR  PLEASE NOTIFY SCHEDULING AT LEAST 24 HOURS IN ADVANCE IF YOU ARE UNABLE TO KEEP YOUR APPOINTMENT. [phone]  For more information and frequently asked questions, please visit our website : [email or web address]  Please call the Cardiac Imaging Nurse Navigators with any questions/concerns. [phone] Office      Follow-Up: At [facility], you and your health needs are our priority.  As part of our continuing mission to provide you with exceptional heart care, we have created designated Provider Care Teams.  These Care Teams include your primary Cardiologist (physician) and Advanced Practice Providers (APPs -  Physician Assistants and Nurse Practitioners) who all work together to provide you with the care you need, when you need it.  We recommend signing up for the patient portal called "MyChart".  Sign up information is provided on this After Visit Summary.  MyChart is used to connect with patients for Virtual Visits (Telemedicine).  Patients are able to view lab/test results, encounter notes, upcoming appointments, etc.  Non-urgent messages can be sent to your provider as well.   To  learn more about what you can do with MyChart, go to [email or web address].    Your next appointment:   1 year(s)  Provider:   Gaylyn Keas, MD     Other Instructions Dr. Micael Adas has referred you to our [facility] Loss Drug clinic which is run by our pharmacists. Someone will call you to set up an appointment.

## 2023-03-15 NOTE — Addendum Note (Signed)
 Addended by: Cherylyn Cos on: 03/15/2023 11:50 AM   Modules accepted: Orders

## 2023-03-17 DIAGNOSIS — Z79899 Other long term (current) drug therapy: Secondary | ICD-10-CM | POA: Diagnosis not present

## 2023-03-17 DIAGNOSIS — E7800 Pure hypercholesterolemia, unspecified: Secondary | ICD-10-CM | POA: Diagnosis not present

## 2023-03-17 LAB — LIPID PANEL
Chol/HDL Ratio: 3.1 {ratio} (ref 0.0–5.0)
Cholesterol, Total: 141 mg/dL (ref 100–199)
HDL: 46 mg/dL (ref >39)
LDL Chol Calc (NIH): 57 mg/dL (ref 0–99)
Triglycerides: 235 mg/dL — ABNORMAL HIGH (ref 0–149)
VLDL Cholesterol Cal: 38 mg/dL (ref 5–40)

## 2023-03-17 LAB — ALT: ALT: 68 [IU]/L — ABNORMAL HIGH (ref 0–44)

## 2023-03-23 ENCOUNTER — Telehealth: Payer: Self-pay

## 2023-03-23 MED ORDER — ICOSAPENT ETHYL 1 G PO CAPS
2.0000 g | ORAL_CAPSULE | Freq: Two times a day (BID) | ORAL | 3 refills | Status: AC
  Filled 2023-04-11: qty 120, 30d supply, fill #0

## 2023-03-23 NOTE — Telephone Encounter (Signed)
-----   Message from Armanda Magic sent at 03/21/2023  9:38 PM EST ----- agree ----- Message ----- From: Cheree Ditto, [facility] Sent: 03/21/2023  12:08 PM EST To: Quintella Reichert, MD; Frutoso Schatz, RN; #  Recommend starting Vascepa 2 grams BID with food ----- Message ----- From: Frutoso Schatz, RN Sent: 03/21/2023   7:57 AM EST To: Luellen Pucker, RN; [facility]   ----- Message ----- From: Quintella Reichert, MD Sent: 03/18/2023   3:15 PM EST To: Frutoso Schatz, RN  Lipids still not at goal.  TAGS too high and ALT elevated .Please forward to lipid clinic for further recommendations.

## 2023-03-23 NOTE — Telephone Encounter (Signed)
Call to patient to explain that per Dr. Mayford Knife, Lipids still not at goal. No answer, left detailed message explaining that TAGS too high and ALT elevated. Lipid clinic recommends starting vascepa. Advised that script would be sent to pharmacy on record and to call us with any questions.

## 2023-03-25 ENCOUNTER — Other Ambulatory Visit: Payer: Self-pay | Admitting: Cardiology

## 2023-04-04 ENCOUNTER — Other Ambulatory Visit: Payer: Self-pay | Admitting: Cardiology

## 2023-04-06 ENCOUNTER — Ambulatory Visit: Payer: Medicare Other | Attending: Internal Medicine | Admitting: Student

## 2023-04-06 ENCOUNTER — Telehealth: Payer: Self-pay | Admitting: Pharmacy Technician

## 2023-04-06 ENCOUNTER — Other Ambulatory Visit (HOSPITAL_COMMUNITY): Payer: Self-pay

## 2023-04-06 ENCOUNTER — Encounter: Payer: Self-pay | Admitting: Student

## 2023-04-06 ENCOUNTER — Telehealth: Payer: Self-pay | Admitting: Pharmacist

## 2023-04-06 VITALS — Ht 70.0 in | Wt 262.6 lb

## 2023-04-06 DIAGNOSIS — G4733 Obstructive sleep apnea (adult) (pediatric): Secondary | ICD-10-CM | POA: Diagnosis not present

## 2023-04-06 DIAGNOSIS — E669 Obesity, unspecified: Secondary | ICD-10-CM

## 2023-04-06 NOTE — Telephone Encounter (Signed)
 GLP1 coverage assessment  requested

## 2023-04-06 NOTE — Telephone Encounter (Signed)
 Pharmacy Patient Advocate Encounter   Received notification from Pt Calls Messages that prior authorization for zepbound  is required/requested.   Insurance verification completed.   The patient is insured through [facility] .   Per test claim: PA required; PA submitted to above mentioned insurance via CoverMyMeds Key/confirmation #/EOC [id number] Status is pending

## 2023-04-06 NOTE — Progress Notes (Signed)
 Patient ID: Patrick Gilmore                 DOB: April 18, 1955                    MRN: [id number]     HPI: Patrick Gilmore is a 68 y.o. male patient referred to pharmacy clinic by Dr.Edmonds to initiate GLP1-RA therapy. PMH is significant for HTN, CAD, OSA,HLD and obesity. Most recent BMI 38.18. and weight 270 lbs   Baseline weight and BMI: 38.18. and weight 270 lbs  Current weight and BMI: 262 lbs and 37.68  Current meds that affect weight: none  Has sleep apnea and does not do well with mask. Due to neuropathy can not do much exercise. Works 3 jobs, deals with lots of stress. He is stress eater, eats wrong food. Enjoys late night snacks ( ice cream, crackers, candies) Lately he has improved on his food choices and lost some weight looking get medication help to achieve some meaningful weight loss.    Family History:  Relation Problem Comments  Mother (Deceased) Breast cancer   Colon polyps     Father (Deceased) Kidney failure     Sister Metallurgist) Breast cancer in remission    Brother (Alive) Diabetes     Brother (Alive) Colon polyps   Hypertension        Labs: Lab Results  Component Value Date   HGBA1C 6.3 12/21/2015    Wt Readings from Last 1 Encounters:  03/15/23 270 lb (122.5 kg)    BP Readings from Last 1 Encounters:  03/15/23 118/64   Pulse Readings from Last 1 Encounters:  03/15/23 82       Component Value Date/Time   CHOL 141 03/17/2023 0904   TRIG 235 (H) 03/17/2023 0904   HDL 46 03/17/2023 0904   CHOLHDL 3.1 03/17/2023 0904   CHOLHDL 3.6 05/13/2015 0844   VLDL 37 (H) 05/13/2015 0844   LDLCALC 57 03/17/2023 0904    Past Medical History:  Diagnosis Date   Aortic atherosclerosis (HCC)    Ascending aorta dilatation (HCC)    43 mm by chest MRI 02/2022   Coronary artery calcification seen on CAT scan    coronary Ca score 102 on 10/2019.  Negative Lexiscan  myoview  10/2019   H/O cardiovascular stress test 2007   EF 62%, decrease anterioseptal egion uptake on  rest images only   Hyperlipidemia    Hypertension    Medically noncompliant    Obesity    OSA (obstructive sleep apnea)    Could not tolerate CPAP now on oral device followed by Dr Melba   Peripheral neuropathy 09/2011   Sleep apnea    Spastic colon    Xiphoid prominence 09/2011    Current Outpatient Medications on File Prior to Visit  Medication Sig Dispense Refill   aspirin EC 81 MG tablet Take 81 mg by mouth daily. Swallow whole.     carvedilol  (COREG ) 25 MG tablet Take 1 tablet (25 mg total) by mouth 2 (two) times daily. Please keep upcoming appt.in Jan.with Dr. Bihari in order to receive future refills. Thank You. 180 tablet 3   doxazosin  (CARDURA ) 2 MG tablet TAKE 1 TABLET BY MOUTH DAILY 90 tablet 3   gabapentin  (NEURONTIN ) 100 MG capsule Take 100 mg by mouth daily. (Patient not taking: Reported on 03/15/2023)     icosapent  Ethyl (VASCEPA ) 1 g capsule Take 2 capsules (2 g total) by mouth 2 (two) times daily. 120 capsule  3   rosuvastatin  (CRESTOR ) 40 MG tablet Take 1 tablet (40 mg total) by mouth daily. Please keep scheduled appointment for future refills. Thank you. 90 tablet 3   valsartan -hydrochlorothiazide  (DIOVAN -HCT) 320-25 MG tablet TAKE 1 TABLET BY MOUTH DAILY 90 tablet 3   Current Facility-Administered Medications on File Prior to Visit  Medication Dose Route Frequency Provider Last Rate Last Admin   0.9 %  sodium chloride  infusion  500 mL Intravenous Once Armbruster, Elspeth SQUIBB, MD        Allergies  Allergen Reactions   Amlodipine Other (See Comments)    Headaches   Norvasc [Amlodipine Besylate]     Headaches   Prednisone      Pt stated, Stopped taking after 7 days; made me feel dizzy; had no energy, felt hot all the time     Assessment/Plan:  1. Weight loss - Patient has not met goal of at least 5% of body weight loss with comprehensive lifestyle modifications alone in the past 3-6 months. Pharmacotherapy is appropriate to pursue as augmentation. Will assess  Zepbound  coverage . Confirmed patient has no personal or family history of medullary thyroid carcinoma (MTC) or Multiple Endocrine Neoplasia syndrome type 2 (MEN 2). Injection technique reviewed at today's visit.  Advised patient on common side effects including nausea, diarrhea, dyspepsia, decreased appetite, and fatigue. Counseled patient on reducing meal size and how to titrate medication to minimize side effects. Counseled patient to call if intolerable side effects or if experiencing dehydration, abdominal pain, or dizziness. Patient will adhere to dietary modifications and will target at least 150 minutes of moderate intensity exercise weekly.   Follow up in 1 month via telephone for tolerability update and dose titration.

## 2023-04-06 NOTE — Telephone Encounter (Signed)
 Faxed over additional sleep study documents

## 2023-04-10 ENCOUNTER — Other Ambulatory Visit (HOSPITAL_BASED_OUTPATIENT_CLINIC_OR_DEPARTMENT_OTHER): Payer: Self-pay

## 2023-04-10 ENCOUNTER — Encounter: Payer: Self-pay | Admitting: Cardiology

## 2023-04-10 ENCOUNTER — Other Ambulatory Visit (HOSPITAL_COMMUNITY): Payer: Self-pay

## 2023-04-10 ENCOUNTER — Telehealth: Payer: Self-pay | Admitting: Cardiology

## 2023-04-10 MED ORDER — TIRZEPATIDE-WEIGHT MANAGEMENT 2.5 MG/0.5ML ~~LOC~~ SOAJ
2.5000 mg | SUBCUTANEOUS | 0 refills | Status: DC
  Filled 2023-04-11: qty 2, 28d supply, fill #0

## 2023-04-10 NOTE — Telephone Encounter (Signed)
 Patient stated he is returning Dr. Basilio Both call.

## 2023-04-10 NOTE — Telephone Encounter (Signed)
 Pharmacy Patient Advocate Encounter  Received notification from [employer] that Prior Authorization for zepbound  has been APPROVED from 04/10/23 to 02/28/24. Ran test claim, Copay is $505.87 one month (DEDUCTIBLE). This test claim was processed through [facility]- copay amounts may vary at other pharmacies due to pharmacy/plan contracts, or as the patient moves through the different stages of their insurance plan.   PA #/Case ID/Reference #: update when fax comes

## 2023-04-10 NOTE — Telephone Encounter (Addendum)
send Zepbound prescription to pharmacy. Will follow up in 3-4 weeks for further dose titration.

## 2023-04-10 NOTE — Telephone Encounter (Signed)
 Patient made aware about PA approval and cost. Patient wants some time to think whether he wants to go on this costly treatment or not. Will discuss with wife and call us  back this afternoon.

## 2023-04-11 ENCOUNTER — Other Ambulatory Visit (HOSPITAL_BASED_OUTPATIENT_CLINIC_OR_DEPARTMENT_OTHER): Payer: Self-pay

## 2023-04-11 MED ORDER — GABAPENTIN 100 MG PO CAPS
100.0000 mg | ORAL_CAPSULE | Freq: Three times a day (TID) | ORAL | 1 refills | Status: AC
  Filled 2023-04-11 – 2023-10-04 (×2): qty 270, 90d supply, fill #0

## 2023-04-11 MED FILL — Doxazosin Mesylate Tab 2 MG: ORAL | 90 days supply | Qty: 90 | Fill #0 | Status: CN

## 2023-04-11 MED FILL — Carvedilol Tab 25 MG: ORAL | 90 days supply | Qty: 180 | Fill #0 | Status: CN

## 2023-04-11 MED FILL — Rosuvastatin Calcium Tab 40 MG: ORAL | 90 days supply | Qty: 90 | Fill #0 | Status: CN

## 2023-04-11 MED FILL — Valsartan-Hydrochlorothiazide Tab 320-25 MG: ORAL | 90 days supply | Qty: 90 | Fill #0 | Status: CN

## 2023-04-12 ENCOUNTER — Other Ambulatory Visit (HOSPITAL_BASED_OUTPATIENT_CLINIC_OR_DEPARTMENT_OTHER): Payer: Self-pay

## 2023-04-18 DIAGNOSIS — H2513 Age-related nuclear cataract, bilateral: Secondary | ICD-10-CM | POA: Diagnosis not present

## 2023-04-27 ENCOUNTER — Telehealth: Payer: Self-pay | Admitting: Pharmacist

## 2023-04-27 ENCOUNTER — Other Ambulatory Visit (HOSPITAL_BASED_OUTPATIENT_CLINIC_OR_DEPARTMENT_OTHER): Payer: Self-pay

## 2023-04-27 MED ORDER — ZEPBOUND 5 MG/0.5ML ~~LOC~~ SOAJ
5.0000 mg | SUBCUTANEOUS | 0 refills | Status: DC
  Filled 2023-04-27: qty 2, 28d supply, fill #0

## 2023-04-27 NOTE — Telephone Encounter (Signed)
 Patient called back reports tolerating current does well. Lost 3 lbs on 1st step. Pt masde aware that prescription for 5 mg sent to [facility].   F/u in 3- 4 weeks

## 2023-04-27 NOTE — Telephone Encounter (Signed)
 Call to titrate Zepbound dose. N/A LVM. MyChart sent.

## 2023-05-03 ENCOUNTER — Other Ambulatory Visit (HOSPITAL_COMMUNITY): Payer: Self-pay | Admitting: *Deleted

## 2023-05-03 DIAGNOSIS — I77810 Thoracic aortic ectasia: Secondary | ICD-10-CM

## 2023-05-04 ENCOUNTER — Ambulatory Visit (HOSPITAL_BASED_OUTPATIENT_CLINIC_OR_DEPARTMENT_OTHER): Payer: Medicare Other

## 2023-05-12 DIAGNOSIS — M25561 Pain in right knee: Secondary | ICD-10-CM | POA: Diagnosis not present

## 2023-05-12 DIAGNOSIS — J45991 Cough variant asthma: Secondary | ICD-10-CM | POA: Diagnosis not present

## 2023-05-12 DIAGNOSIS — J309 Allergic rhinitis, unspecified: Secondary | ICD-10-CM | POA: Diagnosis not present

## 2023-05-12 DIAGNOSIS — J018 Other acute sinusitis: Secondary | ICD-10-CM | POA: Diagnosis not present

## 2023-05-17 ENCOUNTER — Encounter: Payer: Self-pay | Admitting: Pharmacist

## 2023-05-17 NOTE — Telephone Encounter (Signed)
 error

## 2023-05-19 ENCOUNTER — Other Ambulatory Visit

## 2023-05-22 ENCOUNTER — Telehealth: Payer: Self-pay | Admitting: Pharmacist

## 2023-05-22 ENCOUNTER — Other Ambulatory Visit (HOSPITAL_BASED_OUTPATIENT_CLINIC_OR_DEPARTMENT_OTHER): Payer: Self-pay

## 2023-05-22 MED ORDER — ZEPBOUND 7.5 MG/0.5ML ~~LOC~~ SOAJ
7.5000 mg | SUBCUTANEOUS | 0 refills | Status: DC
  Filled 2023-05-22: qty 2, 28d supply, fill #0

## 2023-05-22 NOTE — Telephone Encounter (Signed)
 Spoke to patient, lost 20 lbs ready to increase dose to 7.5 mg. Goal is to achieve 200-210 lbs. Will f/u in 4 weeks for further dose titration

## 2023-05-24 DIAGNOSIS — I10 Essential (primary) hypertension: Secondary | ICD-10-CM | POA: Diagnosis not present

## 2023-05-24 DIAGNOSIS — G4733 Obstructive sleep apnea (adult) (pediatric): Secondary | ICD-10-CM | POA: Diagnosis not present

## 2023-05-24 DIAGNOSIS — E7800 Pure hypercholesterolemia, unspecified: Secondary | ICD-10-CM | POA: Diagnosis not present

## 2023-05-24 DIAGNOSIS — R748 Abnormal levels of other serum enzymes: Secondary | ICD-10-CM | POA: Diagnosis not present

## 2023-05-24 DIAGNOSIS — I712 Thoracic aortic aneurysm, without rupture, unspecified: Secondary | ICD-10-CM | POA: Diagnosis not present

## 2023-05-24 DIAGNOSIS — R7303 Prediabetes: Secondary | ICD-10-CM | POA: Diagnosis not present

## 2023-05-25 ENCOUNTER — Ambulatory Visit
Admission: RE | Admit: 2023-05-25 | Discharge: 2023-05-25 | Disposition: A | Discharge status: Home / Self Care | Source: Ambulatory Visit | Attending: Cardiology | Admitting: Cardiology

## 2023-05-25 DIAGNOSIS — I77810 Thoracic aortic ectasia: Secondary | ICD-10-CM

## 2023-05-25 DIAGNOSIS — I7121 Aneurysm of the ascending aorta, without rupture: Secondary | ICD-10-CM | POA: Diagnosis not present

## 2023-05-25 MED ORDER — GADOPICLENOL 0.5 MMOL/ML IV SOLN
10.0000 mL | Freq: Once | INTRAVENOUS | Status: AC | PRN
  Administered 2023-05-25: 10 mL via INTRAVENOUS

## 2023-05-29 ENCOUNTER — Other Ambulatory Visit: Payer: Self-pay

## 2023-05-29 ENCOUNTER — Telehealth: Payer: Self-pay

## 2023-05-29 DIAGNOSIS — Z01812 Encounter for preprocedural laboratory examination: Secondary | ICD-10-CM

## 2023-05-29 DIAGNOSIS — I77810 Thoracic aortic ectasia: Secondary | ICD-10-CM

## 2023-05-29 NOTE — Telephone Encounter (Signed)
 Spoke with pt regarding pt's results and Delval's suggestions. Pt agreed to have cardiac CTA in one year. A BMET was ordered and released. Coronary CTA was ordered. Pt aware that someone will call to schedule. Pt ok to have instructions sent via MyChart. Pt verbalized understanding. All questions, if any, were answered.

## 2023-05-30 DIAGNOSIS — Z85828 Personal history of other malignant neoplasm of skin: Secondary | ICD-10-CM | POA: Diagnosis not present

## 2023-05-30 DIAGNOSIS — R52 Pain, unspecified: Secondary | ICD-10-CM | POA: Diagnosis not present

## 2023-05-30 DIAGNOSIS — D3617 Benign neoplasm of peripheral nerves and autonomic nervous system of trunk, unspecified: Secondary | ICD-10-CM | POA: Diagnosis not present

## 2023-05-30 DIAGNOSIS — D1801 Hemangioma of skin and subcutaneous tissue: Secondary | ICD-10-CM | POA: Diagnosis not present

## 2023-05-30 DIAGNOSIS — L821 Other seborrheic keratosis: Secondary | ICD-10-CM | POA: Diagnosis not present

## 2023-05-30 DIAGNOSIS — L814 Other melanin hyperpigmentation: Secondary | ICD-10-CM | POA: Diagnosis not present

## 2023-05-30 DIAGNOSIS — D225 Melanocytic nevi of trunk: Secondary | ICD-10-CM | POA: Diagnosis not present

## 2023-05-30 DIAGNOSIS — D171 Benign lipomatous neoplasm of skin and subcutaneous tissue of trunk: Secondary | ICD-10-CM | POA: Diagnosis not present

## 2023-05-30 DIAGNOSIS — D235 Other benign neoplasm of skin of trunk: Secondary | ICD-10-CM | POA: Diagnosis not present

## 2023-06-20 ENCOUNTER — Encounter: Payer: Self-pay | Admitting: Cardiology

## 2023-06-20 ENCOUNTER — Other Ambulatory Visit (HOSPITAL_BASED_OUTPATIENT_CLINIC_OR_DEPARTMENT_OTHER): Payer: Self-pay

## 2023-06-20 MED ORDER — ZEPBOUND 10 MG/0.5ML ~~LOC~~ SOAJ
10.0000 mg | SUBCUTANEOUS | 0 refills | Status: DC
  Filled 2023-06-20: qty 2, 28d supply, fill #0

## 2023-07-02 ENCOUNTER — Other Ambulatory Visit: Payer: Self-pay

## 2023-07-02 MED FILL — Rosuvastatin Calcium Tab 40 MG: ORAL | 90 days supply | Qty: 90 | Fill #0 | Status: AC

## 2023-07-02 MED FILL — Carvedilol Tab 25 MG: ORAL | 90 days supply | Qty: 180 | Fill #0 | Status: AC

## 2023-07-02 MED FILL — Doxazosin Mesylate Tab 2 MG: ORAL | 90 days supply | Qty: 90 | Fill #0 | Status: AC

## 2023-07-02 MED FILL — Valsartan-Hydrochlorothiazide Tab 320-25 MG: ORAL | 90 days supply | Qty: 90 | Fill #0 | Status: AC

## 2023-07-17 ENCOUNTER — Other Ambulatory Visit (HOSPITAL_BASED_OUTPATIENT_CLINIC_OR_DEPARTMENT_OTHER): Payer: Self-pay

## 2023-07-17 MED ORDER — ZEPBOUND 12.5 MG/0.5ML ~~LOC~~ SOAJ
12.5000 mg | SUBCUTANEOUS | 0 refills | Status: DC
  Filled 2023-07-17: qty 2, 28d supply, fill #0

## 2023-07-17 NOTE — Addendum Note (Signed)
 Addended by: Murline Weigel K on: 07/17/2023 03:26 PM   Modules accepted: Orders

## 2023-08-10 ENCOUNTER — Other Ambulatory Visit (HOSPITAL_BASED_OUTPATIENT_CLINIC_OR_DEPARTMENT_OTHER): Payer: Self-pay

## 2023-08-10 MED ORDER — ZEPBOUND 15 MG/0.5ML ~~LOC~~ SOAJ
15.0000 mg | SUBCUTANEOUS | 6 refills | Status: DC
  Filled 2023-08-10: qty 2, 28d supply, fill #0
  Filled 2023-09-08: qty 2, 28d supply, fill #1
  Filled 2023-10-04: qty 2, 28d supply, fill #2
  Filled 2023-11-03: qty 2, 28d supply, fill #3
  Filled 2023-12-05 (×2): qty 2, 28d supply, fill #4
  Filled 2023-12-29: qty 2, 28d supply, fill #5
  Filled 2024-01-26: qty 2, 28d supply, fill #6

## 2023-08-10 NOTE — Addendum Note (Signed)
 Addended by: Richardine Peppers K on: 08/10/2023 01:34 PM   Modules accepted: Orders

## 2023-08-14 ENCOUNTER — Encounter: Payer: Self-pay | Admitting: Cardiology

## 2023-08-14 NOTE — Telephone Encounter (Signed)
 Please review and advise.

## 2023-08-16 ENCOUNTER — Other Ambulatory Visit (HOSPITAL_BASED_OUTPATIENT_CLINIC_OR_DEPARTMENT_OTHER): Payer: Self-pay

## 2023-09-08 MED FILL — Carvedilol Tab 25 MG: ORAL | 90 days supply | Qty: 180 | Fill #1 | Status: AC

## 2023-09-08 MED FILL — Rosuvastatin Calcium Tab 40 MG: ORAL | 90 days supply | Qty: 90 | Fill #1 | Status: AC

## 2023-10-04 ENCOUNTER — Other Ambulatory Visit (HOSPITAL_BASED_OUTPATIENT_CLINIC_OR_DEPARTMENT_OTHER): Payer: Self-pay

## 2023-10-04 ENCOUNTER — Other Ambulatory Visit: Payer: Self-pay

## 2023-10-04 MED FILL — Doxazosin Mesylate Tab 2 MG: ORAL | 90 days supply | Qty: 90 | Fill #1 | Status: AC

## 2023-10-04 MED FILL — Valsartan-Hydrochlorothiazide Tab 320-25 MG: ORAL | 90 days supply | Qty: 90 | Fill #1 | Status: AC

## 2023-11-23 DIAGNOSIS — Z0000 Encounter for general adult medical examination without abnormal findings: Secondary | ICD-10-CM | POA: Diagnosis not present

## 2023-11-23 DIAGNOSIS — I10 Essential (primary) hypertension: Secondary | ICD-10-CM | POA: Diagnosis not present

## 2023-11-23 DIAGNOSIS — R748 Abnormal levels of other serum enzymes: Secondary | ICD-10-CM | POA: Diagnosis not present

## 2023-11-23 DIAGNOSIS — E7800 Pure hypercholesterolemia, unspecified: Secondary | ICD-10-CM | POA: Diagnosis not present

## 2023-12-05 ENCOUNTER — Other Ambulatory Visit (HOSPITAL_BASED_OUTPATIENT_CLINIC_OR_DEPARTMENT_OTHER): Payer: Self-pay

## 2023-12-13 ENCOUNTER — Other Ambulatory Visit (HOSPITAL_BASED_OUTPATIENT_CLINIC_OR_DEPARTMENT_OTHER): Payer: Self-pay

## 2023-12-13 ENCOUNTER — Other Ambulatory Visit: Payer: Self-pay

## 2023-12-13 MED ORDER — SOLIFENACIN SUCCINATE 5 MG PO TABS
5.0000 mg | ORAL_TABLET | Freq: Every day | ORAL | 3 refills | Status: AC
  Filled 2023-12-13: qty 90, 90d supply, fill #0

## 2023-12-14 ENCOUNTER — Encounter: Payer: Self-pay | Admitting: Cardiology

## 2023-12-15 ENCOUNTER — Telehealth: Payer: Self-pay | Admitting: Pharmacy Technician

## 2023-12-15 ENCOUNTER — Other Ambulatory Visit (HOSPITAL_COMMUNITY): Payer: Self-pay

## 2023-12-15 NOTE — Telephone Encounter (Signed)
 Pharmacy Patient Advocate Encounter   Received notification from Physician's Office that prior authorization for zepbound  15 MG is required/requested.   Insurance verification completed.   The patient is insured through [facility].   Per test claim: PA required; PA submitted to above mentioned insurance via Latent Key/confirmation #/EOC [id number] Status is pending

## 2023-12-18 ENCOUNTER — Other Ambulatory Visit (HOSPITAL_COMMUNITY): Payer: Self-pay

## 2023-12-18 NOTE — Telephone Encounter (Signed)
 Pharmacy Patient Advocate Encounter  Received notification from [employer] that Prior Authorization for zepbound  has been APPROVED from 12/15/23 to 02/27/25. Unable to obtain price due to refill too soon rejection, last fill date 12/05/23  next available fill date10/28/25   PA #/Case ID/Reference #: [id number]

## 2023-12-26 ENCOUNTER — Other Ambulatory Visit: Payer: Self-pay | Admitting: Medical Genetics

## 2023-12-28 NOTE — Telephone Encounter (Signed)
 Spoke to patient, clarify all indication for Zepbound  and Mounjaro . Pt will inquire potential future medicare plan about Zepbound  coverage for OSA.

## 2024-01-22 ENCOUNTER — Other Ambulatory Visit: Payer: Self-pay

## 2024-01-22 ENCOUNTER — Other Ambulatory Visit (HOSPITAL_BASED_OUTPATIENT_CLINIC_OR_DEPARTMENT_OTHER): Payer: Self-pay

## 2024-01-22 MED FILL — Valsartan-Hydrochlorothiazide Tab 320-25 MG: ORAL | 90 days supply | Qty: 90 | Fill #2 | Status: AC

## 2024-01-22 MED FILL — Rosuvastatin Calcium Tab 40 MG: ORAL | 90 days supply | Qty: 90 | Fill #2 | Status: AC

## 2024-01-22 MED FILL — Doxazosin Mesylate Tab 2 MG: ORAL | 90 days supply | Qty: 90 | Fill #2 | Status: AC

## 2024-01-22 MED FILL — Carvedilol Tab 25 MG: ORAL | 90 days supply | Qty: 180 | Fill #2 | Status: AC

## 2024-01-26 ENCOUNTER — Other Ambulatory Visit: Payer: Self-pay

## 2024-02-07 ENCOUNTER — Encounter: Payer: Self-pay | Admitting: Cardiology

## 2024-02-10 ENCOUNTER — Other Ambulatory Visit (HOSPITAL_BASED_OUTPATIENT_CLINIC_OR_DEPARTMENT_OTHER): Payer: Self-pay

## 2024-02-10 ENCOUNTER — Encounter (HOSPITAL_BASED_OUTPATIENT_CLINIC_OR_DEPARTMENT_OTHER): Payer: Self-pay

## 2024-02-13 ENCOUNTER — Other Ambulatory Visit (HOSPITAL_BASED_OUTPATIENT_CLINIC_OR_DEPARTMENT_OTHER): Payer: Self-pay

## 2024-02-13 MED ORDER — TRAMADOL HCL 50 MG PO TABS
50.0000 mg | ORAL_TABLET | Freq: Four times a day (QID) | ORAL | 0 refills | Status: AC | PRN
  Filled 2024-02-13: qty 20, 5d supply, fill #0

## 2024-02-14 ENCOUNTER — Other Ambulatory Visit (HOSPITAL_BASED_OUTPATIENT_CLINIC_OR_DEPARTMENT_OTHER): Payer: Self-pay

## 2024-02-14 ENCOUNTER — Other Ambulatory Visit: Payer: Self-pay | Admitting: Pharmacist

## 2024-02-14 MED ORDER — ZEPBOUND 15 MG/0.5ML ~~LOC~~ SOAJ
15.0000 mg | SUBCUTANEOUS | 2 refills | Status: AC
  Filled 2024-02-14 – 2024-02-19 (×2): qty 2, 28d supply, fill #0
  Filled 2024-03-19: qty 2, 28d supply, fill #1

## 2024-02-19 ENCOUNTER — Other Ambulatory Visit (HOSPITAL_BASED_OUTPATIENT_CLINIC_OR_DEPARTMENT_OTHER): Payer: Self-pay

## 2024-03-19 ENCOUNTER — Other Ambulatory Visit (HOSPITAL_BASED_OUTPATIENT_CLINIC_OR_DEPARTMENT_OTHER): Payer: Self-pay

## 2024-03-21 ENCOUNTER — Other Ambulatory Visit (HOSPITAL_BASED_OUTPATIENT_CLINIC_OR_DEPARTMENT_OTHER): Payer: Self-pay

## 2024-03-21 ENCOUNTER — Other Ambulatory Visit: Payer: Self-pay

## 2024-03-21 ENCOUNTER — Ambulatory Visit: Attending: Cardiology | Admitting: Cardiology

## 2024-03-21 VITALS — BP 110/70 | HR 86 | Ht 71.0 in | Wt 234.0 lb

## 2024-03-21 DIAGNOSIS — E669 Obesity, unspecified: Secondary | ICD-10-CM | POA: Diagnosis not present

## 2024-03-21 DIAGNOSIS — I10 Essential (primary) hypertension: Secondary | ICD-10-CM | POA: Diagnosis not present

## 2024-03-21 DIAGNOSIS — R931 Abnormal findings on diagnostic imaging of heart and coronary circulation: Secondary | ICD-10-CM

## 2024-03-21 DIAGNOSIS — G4733 Obstructive sleep apnea (adult) (pediatric): Secondary | ICD-10-CM

## 2024-03-21 DIAGNOSIS — I77810 Thoracic aortic ectasia: Secondary | ICD-10-CM | POA: Diagnosis not present

## 2024-03-21 DIAGNOSIS — E7800 Pure hypercholesterolemia, unspecified: Secondary | ICD-10-CM

## 2024-03-21 MED ORDER — ROSUVASTATIN CALCIUM 40 MG PO TABS
40.0000 mg | ORAL_TABLET | Freq: Every day | ORAL | 3 refills | Status: AC
  Filled 2024-03-21: qty 90, 90d supply, fill #0

## 2024-03-21 MED ORDER — VALSARTAN-HYDROCHLOROTHIAZIDE 160-12.5 MG PO TABS
1.0000 | ORAL_TABLET | Freq: Every day | ORAL | 3 refills | Status: AC
  Filled 2024-03-21: qty 90, 90d supply, fill #0

## 2024-03-21 MED ORDER — CARVEDILOL 12.5 MG PO TABS
12.5000 mg | ORAL_TABLET | Freq: Two times a day (BID) | ORAL | 3 refills | Status: AC
  Filled 2024-03-21: qty 180, 90d supply, fill #0

## 2024-03-21 MED ORDER — ZEPBOUND 15 MG/0.5ML ~~LOC~~ SOAJ: 15.0000 mg | SUBCUTANEOUS | 2 refills | Status: AC

## 2024-03-21 NOTE — Addendum Note (Signed)
 Addended by: GLADIS REENA GAILS on: 03/21/2024 10:22 AM   Modules accepted: Orders

## 2024-03-21 NOTE — Patient Instructions (Addendum)
 Medication Instructions:    Decrease Carvedilol  to 12.5 mg twice a day  Decrease Divan HCT 160/12.5 mg daily     Stop  taking Cardura    Refills of  Zepbound   Rosuvastatin   *If you need a refill on your cardiac medications before your next appointment, please call your pharmacy*   Lab Work: today  Fastin lipid  ALt If you have labs (blood work) drawn today and your tests are completely normal, you will receive your results only by: MyChart Message (if you have MyChart) OR A paper copy in the mail If you have any lab test that is abnormal or we need to change your treatment, we will call you to review the results.   Testing/Procedures:   You are schedule to have MRA/MRI of your chest for dilated aorta   Follow-Up: At [facility], you and your health needs are our priority.  As part of our continuing mission to provide you with exceptional heart care, we have created designated Provider Care Teams.  These Care Teams include your primary Cardiologist (physician) and Advanced Practice Providers (APPs -  Physician Assistants and Nurse Practitioners) who all work together to provide you with the care you need, when you need it.     Your next appointment:   12 month(s)  The format for your next appointment:   In Person  Provider:   Wilbert Bihari, MD

## 2024-03-21 NOTE — Addendum Note (Signed)
 Addended by: GLADIS REENA GAILS on: 03/21/2024 10:30 AM   Modules accepted: Orders

## 2024-03-21 NOTE — Progress Notes (Signed)
 "   Date:  03/21/2024   ID:  Patrick Gilmore, DOB 07-02-1955, MRN [id number]  PCP:  Loretha Richerd SAUNDERS, MD  Cardiologist:  Wilbert Bihari, MD Electrophysiologist:  None   Chief Complaint:  HTN  History of Present Illness:    Patrick Gilmore is a 69 y.o. male with a hx of HTN, obesity, dyslipidemia and OSA on CPAP. He has a hx of moderate ascending aortic aneurysm by CT in 2018.  This also showed some calcifications in the coronary arteries.  He has not had any chest pain although he says when he does not use his PAP device his chest wall  Pain but has not chest pressure or discomfort with exertion and no DOE.   The patient is here today and is doing well.  Unfortunately he was dx with Prostate CA.  He denies any CP or pressure, SOB, DOE, PND, orthopnea, LE edema, palpitations or syncope.  He has lost over 35lbs on Zepbound .  He started having problems with dizziness and SBP was dropping into the 80's.  He cut the Valsartan  HCT to 1/2 tablet and Carvedilol  25mg  daily.  He has still been taking the doxazosin  still.  SBP has been in the 110-120's.   Prior CV studies:   The following studies were reviewed today: EKG  Past Medical History:  Diagnosis Date   Aortic atherosclerosis    Ascending aorta dilatation    43 mm by chest MRI 02/2022   Coronary artery calcification seen on CAT scan    coronary Ca score 102 on 10/2019.  Negative Lexiscan  myoview  10/2019   H/O cardiovascular stress test 2007   EF 62%, decrease anterioseptal egion uptake on rest images only   Hyperlipidemia    Hypertension    Medically noncompliant    Obesity    OSA (obstructive sleep apnea)    Could not tolerate CPAP now on oral device followed by Dr Melba   Peripheral neuropathy 09/2011   Sleep apnea    Spastic colon    Xiphoid prominence 09/2011   Past Surgical History:  Procedure Laterality Date   COLONOSCOPY  11/28/2008   Gessner-normal exam   SHOULDER SURGERY  1978     Current Meds  Medication Sig    carvedilol  (COREG ) 25 MG tablet Take 1 tablet (25 mg total) by mouth 2 (two) times daily.   doxazosin  (CARDURA ) 2 MG tablet Take 1 tablet (2 mg total) by mouth daily.   rosuvastatin  (CRESTOR ) 40 MG tablet Take 1 tablet (40 mg total) by mouth daily.   tirzepatide  (ZEPBOUND ) 15 MG/0.5ML Pen Inject 15 mg into the skin once a week.   valsartan -hydrochlorothiazide  (DIOVAN -HCT) 320-25 MG tablet Take 1 tablet by mouth daily. (Patient taking differently: Take 1 tablet by mouth daily. Taking half tablet daily.)   Current Facility-Administered Medications for the 03/21/24 encounter (Office Visit) with Bihari Wilbert SAUNDERS, MD  Medication   0.9 %  sodium chloride  infusion     Allergies:   Amlodipine, Norvasc [amlodipine besylate], and Prednisone    Social History   Tobacco Use   Smoking status: Never   Smokeless tobacco: Never  Vaping Use   Vaping status: Never Used  Substance Use Topics   Alcohol use: Yes    Alcohol/week: 4.0 standard drinks of alcohol    Types: 4 Glasses of wine per week   Drug use: No     Family Hx: The patient's family history includes Breast cancer in his mother and sister; Colon polyps in his brother  and mother; Diabetes in his brother; Hypertension in his brother; Kidney failure in his father. There is no history of Colon cancer, Stomach cancer, Rectal cancer, Esophageal cancer, or Liver cancer.  ROS:   Please see the history of present illness.     All other systems reviewed and are negative.   Labs/Other Tests and Data Reviewed:    EKG Interpretation Date/Time:  Thursday March 21 2024 09:48:16 EST Ventricular Rate:  86 PR Interval:  150 QRS Duration:  82 QT Interval:  358 QTC Calculation: 428 R Axis:   69  Text Interpretation: Normal sinus rhythm Low voltage QRS Possible Lateral infarct , age undetermined Confirmed by Shlomo Corning ([id number]) on 03/21/2024 9:50:21 AM    Recent Labs: No results found for requested labs within last 365 days.   Recent Lipid  Panel Lab Results  Component Value Date/Time   CHOL 141 03/17/2023 09:04 AM   TRIG 235 (H) 03/17/2023 09:04 AM   HDL 46 03/17/2023 09:04 AM   CHOLHDL 3.1 03/17/2023 09:04 AM   CHOLHDL 3.6 05/13/2015 08:44 AM   LDLCALC 57 03/17/2023 09:04 AM    Wt Readings from Last 3 Encounters:  03/21/24 234 lb (106.1 kg)  04/06/23 262 lb 9.6 oz (119.1 kg)  03/15/23 270 lb (122.5 kg)     Objective:    Vital Signs:  BP 110/70 (BP Location: Left Arm, Patient Position: Sitting, Cuff Size: Large)   Pulse 86   Ht 5' 11 (1.803 m)   Wt 234 lb (106.1 kg)   SpO2 96%   BMI 32.64 kg/m    GEN: Well nourished, well developed in no acute distress HEENT: Normal NECK: No JVD; No carotid bruits LYMPHATICS: No lymphadenopathy CARDIAC:RRR, no murmurs, rubs, gallops RESPIRATORY:  Clear to auscultation without rales, wheezing or rhonchi  ABDOMEN: Soft, non-tender, non-distended MUSCULOSKELETAL:  No edema; No deformity  SKIN: Warm and dry NEUROLOGIC:  Alert and oriented x 3 PSYCHIATRIC:  Normal affect  ASSESSMENT & PLAN:    OSA  -[facility]   Hypertension  -BP controlled on exam today at 110/86mmHg -He has had some problems with low BP and actually cut his Valsartan  HCT  in half and has only been taking Carvedilol  25mg  once daily along with Doxazosin  -will stop doxazosin  -continue Valsartan  HCT at 160-12.5mg  daily -decrease Carvedilol  to 12.5mg  BID -I have personally reviewed and interpreted outside labs performed by patient's PCP which showed SCr 1.03 and K+ 4.3 on 10/2023   Ascending aortic dilatation -4.5cm by Chest CT in 2018 -Chest CTA 10/2020 stable at 4.2cm -Chest MRI/MRA 4.3cm on 02/2022 -Chest MRI/MRA 4.3 cm on 05/18/2023 -repeat MRI/MRA of chest -BP is well-controlled  HLD -LDL goal < 70 -I have personally reviewed and interpreted outside labs performed by patient's PCP which showed LDL 54 and HDL 54 and TAGs 248 on 11/23/2023 -check FLP and ALT -Continue Crestor  40 mg  daily with as needed refills -if TAGs are still elevated will restart Vascepa   Coronary artery calcification -coronary Ca score was 102 on CT in 2021 -normal nuclear stress test 10/2019 -He has not had any recent anginal symptoms -Continue aspirin 81 mg daily, carvedilol  12.5mg  BID and Crestor  40 mg daily with as needed refills  Morbid Obesity -Currently on Zepbound  -He has lost 36 pounds in the past year -refill Zepbound   Followup with me in 1 year  Medication Adjustments/Labs and Tests Ordered: Current medicines are reviewed at length with the patient today.  Concerns regarding medicines are outlined above.  Tests Ordered: Orders Placed This Encounter  Procedures   EKG 12-Lead    Medication Changes: No orders of the defined types were placed in this encounter.    Disposition:  Follow up in 1 year(s)  Signed, Wilbert Bihari, MD  03/21/2024 9:56 AM    [facility] "

## 2024-03-22 ENCOUNTER — Other Ambulatory Visit (HOSPITAL_BASED_OUTPATIENT_CLINIC_OR_DEPARTMENT_OTHER): Payer: Self-pay

## 2024-03-26 LAB — LIPID PANEL
Chol/HDL Ratio: 2.6 ratio (ref 0.0–5.0)
Cholesterol, Total: 149 mg/dL (ref 100–199)
HDL: 57 mg/dL
LDL Chol Calc (NIH): 70 mg/dL (ref 0–99)
Triglycerides: 124 mg/dL (ref 0–149)
VLDL Cholesterol Cal: 22 mg/dL (ref 5–40)

## 2024-03-26 LAB — ALT: ALT: 37 [IU]/L (ref 0–44)

## 2024-03-27 ENCOUNTER — Ambulatory Visit: Payer: Self-pay | Admitting: Cardiology

## 2024-03-29 NOTE — Progress Notes (Unsigned)
 [facility] CONSULT NOTE  Patient Care Team: Rankins, Patrick SAUNDERS, MD as PCP - General (Family Medicine) Patrick Wilbert SAUNDERS, MD as PCP - Cardiology (Cardiology) Patrick Wilbert SAUNDERS, MD as PCP - Sleep Medicine (Cardiology)  ASSESSMENT & PLAN:  Patrick Gilmore is a 69 y.o.male with history of CAD, ascending aortic aneurysm, hyperlipidemia, OSA, hypertension [facility].  Patient has outside records with biopsy showed greater than 50% biopsy cores positive with GG2 categorized him into unfavorable intermediate risk.  However his MRI reported broad contact with margin is concerning for early EPE.  I recommend with the patient review at multidisciplinary tumor board, review of his MRI imaging with radiologist.  Initial diagnosis: Gleason score 3+4=7 GG2 in 8 cores and one 3+3.   PSA 2.4 in 2025.  Germline testing: Somatic testing: Treatment:  The patient was counseled on the natural history of prostate cancer and the standard treatment options that are available for prostate cancer.   Assessment & Plan   No orders of the defined types were placed in this encounter.   Supportive baseline bone mineral density study and then every 2 years calcium  (1000-1200 mg daily from food and supplements) and vitamin D3 (1000 IU daily) Zometa (5 mg IV annually) for osteopenia (T-score between -1.0 and -2.5) on ADT after dental clearance. If CRPC, 4 mg every 3 months if having bone metastases. Healthy lifestyle to prevent diabetes and CV disease Aggressive cardiovascular risk management Weight-bearing exercises (30 minutes per day) Limit alcohol consumption and avoid smoking  All questions were answered. The patient knows to call the clinic with any problems, questions or concerns. No barriers to learning was detected.  Patrick JAYSON Chihuahua, MD 1/30/20264:35 PM  CHIEF COMPLAINTS/PURPOSE OF CONSULTATION:  Prostate cancer  HISTORY OF PRESENTING ILLNESS:  Patrick Gilmore 69 y.o. male is here because of prostate cancer. I have reviewed his chart and materials related to his cancer extensively and collaborated history with the patient. Summary of oncologic history is as follows:  Patient was seen by urologist at [facility] Dr. Chales for elevated PSA.  Records show PSA was 3.81 in 2024, 2.4 in 2025.  Rectal exam on 12/13/2023 showed 3+ prostate and left-sided prostate nodule, nontender.  AUA IPSS 7.  On 01/01/2023 MRI of the pelvis reported prostate 28 cc.  One PI-RADS 5 lesion at left peripheral zone posterior lateral mid gland to apex.  1.8 cm.  Broad contact with margin is concerning for early EPE.  A second lesion reported PI-RADS 4 right peripheral zone posterior lateral mid gland 0.8 cm.  Broad contact with margin is concerning for early EPE.  Patient had TRUS on 02/13/2024.  Pathology showed prostate adenocarcinoma, highest Gleason score 3+4=7 GG2 in 8 cores and one 3+3.  ROI at the left posterior mid gland to apex was 3+4.  ROI #2 and right posterior mid gland to apex was 3+4.  Reports strong family history of cancer.  He also has history of benign colon polyps inquiring about Lynch syndrome.  Oncology History   No problem history exists.    MEDICAL HISTORY:  Past Medical History:  Diagnosis Date   Aortic atherosclerosis    Ascending aorta dilatation    43 mm by chest MRI 02/2022   Coronary artery calcification seen on CAT scan    coronary Ca score 102 on 10/2019.  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    SURGICAL HISTORY: Past Surgical History:  Procedure Laterality Date    COLONOSCOPY  11/28/2008   Gessner-normal exam   SHOULDER SURGERY  1978    SOCIAL HISTORY: Social History   Socioeconomic History   Marital status: Married    Spouse name: Patrick Gilmore   Number of children: 2   Years of education: college   Highest education level: Not on file  Occupational History   Occupation: self employed  Tobacco Use   Smoking status: Never   Smokeless tobacco: Never  Vaping Use   Vaping status: Never Used  Substance and Sexual Activity   Alcohol use: Yes    Alcohol/week: 4.0 standard drinks of alcohol    Types: 4 Glasses of wine per week   Drug use: No   Sexual activity: Not on file  Other Topics Concern   Not on file  Social History Narrative   Not on file   Social Drivers of Health   Tobacco Use: Low Risk (01/03/2024)   Received from [facility]   Patient History    Smoking Tobacco Use: Never    Smokeless Tobacco Use: Never    Passive Exposure: Not on file  Financial Resource Strain: Not on file  Food Insecurity: Low Risk (03/13/2024)   Received from [facility]   Epic    Within the past 12 months, you worried that your food would run out before you got money to buy more: Never true    Within the past 12 months, the food you bought just didn't last and you didn't have money to get more. : Never true  Transportation Needs: No Transportation Needs (03/13/2024)   Received from [facility]    In the past 12 months, has lack of reliable transportation kept you from medical appointments, meetings, work or from getting things needed for daily living? : No  Physical Activity: Not on file  Stress: Not on file  Social Connections: Not on file  Intimate Partner Violence: Not on file  Depression (EYV7-0): Not on file  Alcohol Screen: Not on file  Housing: Low Risk (03/13/2024)   Received from [facility]   Epic    What is your living situation today?: I have a steady place to live    Think about the place you live. Do you have  problems with any of the following? Choose all that apply:: None/None on this list  Utilities: Low Risk (03/13/2024)   Received from [facility]   Utilities    In the past 12 months has the electric, gas, oil, or water company threatened to shut off services in your home? : No  Health Literacy: Not on file    FAMILY HISTORY: Family History  Problem Relation Age of Onset   Breast cancer Mother    Colon polyps Mother    Kidney failure Father    Breast cancer Sister        in remission   Diabetes Brother    Hypertension Brother    Colon polyps Brother    Colon cancer Neg Hx    Stomach cancer Neg Hx  Rectal cancer Neg Hx    Esophageal cancer Neg Hx    Liver cancer Neg Hx     ALLERGIES:  is allergic to amlodipine, norvasc [amlodipine besylate], and prednisone .  MEDICATIONS:  Current Outpatient Medications  Medication Sig Dispense Refill   aspirin EC 81 MG tablet Take 81 mg by mouth daily. Swallow whole. (Patient not taking: Reported on 03/21/2024)     carvedilol  (COREG ) 12.5 MG tablet Take 1 tablet (12.5 mg total) by mouth 2 (two) times daily. 180 tablet 3   gabapentin  (NEURONTIN ) 100 MG capsule Take 1 capsule (100 mg total) by mouth every night. Can increase to 1 capsule up to 3 (three) times daily. (Patient not taking: Reported on 03/21/2024) 270 capsule 1   icosapent  Ethyl (VASCEPA ) 1 g capsule Take 2 capsules (2 g total) by mouth 2 (two) times daily. 120 capsule 3   rosuvastatin  (CRESTOR ) 40 MG tablet Take 1 tablet (40 mg total) by mouth daily. 90 tablet 3   solifenacin  (VESICARE ) 5 MG tablet Take 1 tablet (5 mg total) by mouth daily. Swallow tablet whole; do not crush, chew, or split. (Patient not taking: Reported on 03/21/2024) 90 tablet 3   tirzepatide  (ZEPBOUND ) 15 MG/0.5ML Pen Inject 15 mg into the skin once a week. 2 mL 2   traMADol  (ULTRAM ) 50 MG tablet Take 1 tablet (50 mg total) by mouth every 6 (six) hours as needed for pain (Patient not taking: Reported on 03/21/2024)  20 tablet 0   valsartan -hydrochlorothiazide  (DIOVAN  HCT) 160-12.5 MG tablet Take 1 tablet by mouth daily. 90 tablet 3   Current Facility-Administered Medications  Medication Dose Route Frequency Provider Last Rate Last Admin   0.9 %  sodium chloride  infusion  500 mL Intravenous Once Armbruster, Elspeth SQUIBB, MD        REVIEW OF SYSTEMS:   All relevant systems were reviewed with the patient and are negative.  PHYSICAL EXAMINATION: ECOG PERFORMANCE STATUS: {CHL ONC ECOG [phone]}  There were no vitals filed for this visit. There were no vitals filed for this visit.  GENERAL: alert, no distress and comfortable SKIN: skin color is normal, no jaundice, rashes EYES: sclera clear OROPHARYNX: no exudate, no erythema NECK: supple LYMPH:  no palpable lymphadenopathy in the cervical, axillary regions LUNGS: Effort normal, no respiratory distress.  Clear to auscultation bilaterally HEART: regular rate & rhythm and no lower extremity edema ABDOMEN: soft, non-tender and nondistended Musculoskeletal: no point tenderness NEURO: no focal motor/sensory deficits  LABORATORY DATA:  I have reviewed the results of PSA.  RADIOGRAPHIC STUDIES: I have personally reviewed the radiological images as listed and agreed with the findings in the report.

## 2024-04-01 ENCOUNTER — Inpatient Hospital Stay

## 2024-04-04 NOTE — Progress Notes (Unsigned)
 [facility] CONSULT NOTE  Patient Care Team: Rankins, Richerd SAUNDERS, MD as PCP - General (Family Medicine) [name], MD as PCP - Cardiology (Cardiology) [name], MD as PCP - Sleep Medicine (Cardiology)  ASSESSMENT & PLAN:  Patrick Gilmore is a 69 y.o.male with history of CAD, ascending aortic aneurysm, hyperlipidemia, OSA, hypertension [facility].  Patient has outside records with biopsy showed greater than 50% biopsy cores positive with GG2 categorized him into unfavorable intermediate risk.  However his MRI reported broad contact with margin is concerning for early EPE.  I recommend with the patient review at multidisciplinary tumor board, review of his MRI imaging with radiologist.  Initial diagnosis: Gleason score 3+4=7 GG2 in 8 cores and one 3+3.   PSA 2.4 in 2025.  Germline testing: Somatic testing: Treatment:  The patient was counseled on the natural history of prostate cancer and the standard treatment options that are available for prostate cancer.   Assessment & Plan   No orders of the defined types were placed in this encounter.   Supportive baseline bone mineral density study and then every 2 years calcium  (1000-1200 mg daily from food and supplements) and vitamin D3 (1000 IU daily) Zometa (5 mg IV annually) for osteopenia (T-score between -1.0 and -2.5) on ADT after dental clearance. If CRPC, 4 mg every 3 months if having bone metastases. Healthy lifestyle to prevent diabetes and CV disease Aggressive cardiovascular risk management Weight-bearing exercises (30 minutes per day) Limit alcohol consumption and avoid smoking  All questions were answered. The patient knows to call the clinic with any problems, questions or concerns. No barriers to learning was detected.  Pauletta JAYSON Chihuahua, MD 2/5/202611:31 PM  CHIEF COMPLAINTS/PURPOSE OF CONSULTATION:  Prostate cancer  HISTORY OF PRESENTING ILLNESS:  Patrick Gilmore 69 y.o. male is here because of prostate cancer. I have reviewed his chart and materials related to his cancer extensively and collaborated history with the patient. Summary of oncologic history is as follows:  Patient was seen by urologist at [facility] Dr. Chales for elevated PSA.  Records show PSA was 3.81 in 2024, 2.4 in 2025.  Rectal exam on 12/13/2023 showed 3+ prostate and left-sided prostate nodule, nontender.  AUA IPSS 7.  On 01/01/2023 MRI of the pelvis reported prostate 28 cc.  One PI-RADS 5 lesion at left peripheral zone posterior lateral mid gland to apex.  1.8 cm.  Broad contact with margin is concerning for early EPE.  A second lesion reported PI-RADS 4 right peripheral zone posterior lateral mid gland 0.8 cm.  Broad contact with margin is concerning for early EPE.  Patient had TRUS on 02/13/2024.  Pathology showed prostate adenocarcinoma, highest Gleason score 3+4=7 GG2 in 8 cores and one 3+3.  ROI at the left posterior mid gland to apex was 3+4.  ROI #2 and right posterior mid gland to apex was 3+4.  Reports strong family history of cancer.  He also has history of benign colon polyps inquiring about Lynch syndrome.  Oncology History   No problem history exists.    MEDICAL HISTORY:  Past Medical History:  Diagnosis Date   Aortic atherosclerosis    Ascending aorta dilatation    43 mm by chest MRI 02/2022   Coronary artery calcification seen on CAT scan    coronary Ca score 102 on 10/2019.  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    SURGICAL HISTORY: Past Surgical History:  Procedure Laterality Date    COLONOSCOPY  11/28/2008   Gessner-normal exam   SHOULDER SURGERY  1978    SOCIAL HISTORY: Social History   Socioeconomic History   Marital status: Married    Spouse name: leslie   Number of children: 2   Years of education: college   Highest education level: Not on file  Occupational History   Occupation: self employed  Tobacco Use   Smoking status: Never   Smokeless tobacco: Never  Vaping Use   Vaping status: Never Used  Substance and Sexual Activity   Alcohol use: Yes    Alcohol/week: 4.0 standard drinks of alcohol    Types: 4 Glasses of wine per week   Drug use: No   Sexual activity: Not on file  Other Topics Concern   Not on file  Social History Narrative   Not on file   Social Drivers of Health   Tobacco Use: Low Risk (01/03/2024)   Received from [facility]   Patient History    Smoking Tobacco Use: Never    Smokeless Tobacco Use: Never    Passive Exposure: Not on file  Financial Resource Strain: Not on file  Food Insecurity: Low Risk (03/13/2024)   Received from [facility]   Epic    Within the past 12 months, you worried that your food would run out before you got money to buy more: Never true    Within the past 12 months, the food you bought just didn't last and you didn't have money to get more. : Never true  Transportation Needs: No Transportation Needs (03/13/2024)   Received from [facility]    In the past 12 months, has lack of reliable transportation kept you from medical appointments, meetings, work or from getting things needed for daily living? : No  Physical Activity: Not on file  Stress: Not on file  Social Connections: Not on file  Intimate Partner Violence: Not on file  Depression (EYV7-0): Not on file  Alcohol Screen: Not on file  Housing: Low Risk (03/13/2024)   Received from [facility]   Epic    What is your living situation today?: I have a steady place to live    Think about the place you live. Do you have  problems with any of the following? Choose all that apply:: None/None on this list  Utilities: Low Risk (03/13/2024)   Received from [facility]   Utilities    In the past 12 months has the electric, gas, oil, or water company threatened to shut off services in your home? : No  Health Literacy: Not on file    FAMILY HISTORY: Family History  Problem Relation Age of Onset   Breast cancer Mother    Colon polyps Mother    Kidney failure Father    Breast cancer Sister        in remission   Diabetes Brother    Hypertension Brother    Colon polyps Brother    Colon cancer Neg Hx    Stomach cancer Neg Hx  Rectal cancer Neg Hx    Esophageal cancer Neg Hx    Liver cancer Neg Hx     ALLERGIES:  is allergic to amlodipine, norvasc [amlodipine besylate], and prednisone .  MEDICATIONS:  Current Outpatient Medications  Medication Sig Dispense Refill   aspirin EC 81 MG tablet Take 81 mg by mouth daily. Swallow whole. (Patient not taking: Reported on 03/21/2024)     carvedilol  (COREG ) 12.5 MG tablet Take 1 tablet (12.5 mg total) by mouth 2 (two) times daily. 180 tablet 3   gabapentin  (NEURONTIN ) 100 MG capsule Take 1 capsule (100 mg total) by mouth every night. Can increase to 1 capsule up to 3 (three) times daily. (Patient not taking: Reported on 03/21/2024) 270 capsule 1   icosapent  Ethyl (VASCEPA ) 1 g capsule Take 2 capsules (2 g total) by mouth 2 (two) times daily. 120 capsule 3   rosuvastatin  (CRESTOR ) 40 MG tablet Take 1 tablet (40 mg total) by mouth daily. 90 tablet 3   solifenacin  (VESICARE ) 5 MG tablet Take 1 tablet (5 mg total) by mouth daily. Swallow tablet whole; do not crush, chew, or split. (Patient not taking: Reported on 03/21/2024) 90 tablet 3   tirzepatide  (ZEPBOUND ) 15 MG/0.5ML Pen Inject 15 mg into the skin once a week. 2 mL 2   traMADol  (ULTRAM ) 50 MG tablet Take 1 tablet (50 mg total) by mouth every 6 (six) hours as needed for pain (Patient not taking: Reported on 03/21/2024)  20 tablet 0   valsartan -hydrochlorothiazide  (DIOVAN  HCT) 160-12.5 MG tablet Take 1 tablet by mouth daily. 90 tablet 3   Current Facility-Administered Medications  Medication Dose Route Frequency Provider Last Rate Last Admin   0.9 %  sodium chloride  infusion  500 mL Intravenous Once Armbruster, Elspeth SQUIBB, MD        REVIEW OF SYSTEMS:   All relevant systems were reviewed with the patient and are negative.  PHYSICAL EXAMINATION: ECOG PERFORMANCE STATUS: {CHL ONC ECOG PS:[id number]}  There were no vitals filed for this visit. There were no vitals filed for this visit.  GENERAL: alert, no distress and comfortable SKIN: skin color is normal, no jaundice, rashes EYES: sclera clear OROPHARYNX: no exudate, no erythema NECK: supple LYMPH:  no palpable lymphadenopathy in the cervical, axillary regions LUNGS: Effort normal, no respiratory distress.  Clear to auscultation bilaterally HEART: regular rate & rhythm and no lower extremity edema ABDOMEN: soft, non-tender and nondistended Musculoskeletal: no point tenderness NEURO: no focal motor/sensory deficits  LABORATORY DATA:  I have reviewed the results of PSA.  RADIOGRAPHIC STUDIES: I have personally reviewed the radiological images as listed and agreed with the findings in the report.

## 2024-04-05 ENCOUNTER — Ambulatory Visit (HOSPITAL_BASED_OUTPATIENT_CLINIC_OR_DEPARTMENT_OTHER): Admission: RE | Admit: 2024-04-05

## 2024-04-05 DIAGNOSIS — I77810 Thoracic aortic ectasia: Secondary | ICD-10-CM

## 2024-04-05 MED ORDER — GADOBUTROL 1 MMOL/ML IV SOLN
10.0000 mL | Freq: Once | INTRAVENOUS | Status: AC | PRN
  Administered 2024-04-05: 10 mL via INTRAVENOUS
  Filled 2024-04-05: qty 10

## 2024-04-12 ENCOUNTER — Inpatient Hospital Stay

## 2024-04-19 ENCOUNTER — Other Ambulatory Visit (HOSPITAL_COMMUNITY)

## 2024-05-20 ENCOUNTER — Other Ambulatory Visit (HOSPITAL_COMMUNITY)

## 2024-05-21 ENCOUNTER — Ambulatory Visit: Admitting: Cardiology
